# Patient Record
Sex: Female | Born: 1975 | Race: Black or African American | Hispanic: No | Marital: Single | Smoking: Never smoker
Health system: Southern US, Community
[De-identification: ages and names within clinical notes are randomized; demographics above are authoritative.]

## PROBLEM LIST (undated history)

## (undated) DIAGNOSIS — E785 Hyperlipidemia, unspecified: Secondary | ICD-10-CM

## (undated) DIAGNOSIS — A048 Other specified bacterial intestinal infections: Secondary | ICD-10-CM

## (undated) DIAGNOSIS — E559 Vitamin D deficiency, unspecified: Secondary | ICD-10-CM

## (undated) DIAGNOSIS — S12000A Unspecified displaced fracture of first cervical vertebra, initial encounter for closed fracture: Secondary | ICD-10-CM

## (undated) DIAGNOSIS — K589 Irritable bowel syndrome without diarrhea: Secondary | ICD-10-CM

## (undated) DIAGNOSIS — J309 Allergic rhinitis, unspecified: Secondary | ICD-10-CM

## (undated) DIAGNOSIS — J45909 Unspecified asthma, uncomplicated: Secondary | ICD-10-CM

## (undated) DIAGNOSIS — S0292XA Unspecified fracture of facial bones, initial encounter for closed fracture: Secondary | ICD-10-CM

## (undated) DIAGNOSIS — R0989 Other specified symptoms and signs involving the circulatory and respiratory systems: Secondary | ICD-10-CM

## (undated) DIAGNOSIS — D649 Anemia, unspecified: Secondary | ICD-10-CM

## (undated) DIAGNOSIS — R739 Hyperglycemia, unspecified: Secondary | ICD-10-CM

## (undated) HISTORY — DX: Allergic rhinitis, unspecified: J30.9

## (undated) HISTORY — DX: Unspecified asthma, uncomplicated: J45.909

## (undated) HISTORY — DX: Hyperlipidemia, unspecified: E78.5

## (undated) HISTORY — PX: COLPOSCOPY: SHX161

## (undated) HISTORY — DX: Unspecified displaced fracture of first cervical vertebra, initial encounter for closed fracture: S12.000A

## (undated) HISTORY — DX: Anemia, unspecified: D64.9

## (undated) HISTORY — DX: Other specified symptoms and signs involving the circulatory and respiratory systems: R09.89

## (undated) HISTORY — DX: Vitamin D deficiency, unspecified: E55.9

## (undated) HISTORY — DX: Hyperglycemia, unspecified: R73.9

## (undated) HISTORY — DX: Irritable bowel syndrome without diarrhea: K58.9

## (undated) HISTORY — DX: Other specified bacterial intestinal infections: A04.8

## (undated) HISTORY — DX: Unspecified fracture of facial bones, initial encounter for closed fracture: S02.92XA

---

## 1998-01-30 ENCOUNTER — Emergency Department (HOSPITAL_COMMUNITY): Admission: EM | Admit: 1998-01-30 | Discharge: 1998-01-30 | Payer: Self-pay | Admitting: Emergency Medicine

## 1998-02-14 ENCOUNTER — Emergency Department (HOSPITAL_COMMUNITY): Admission: EM | Admit: 1998-02-14 | Discharge: 1998-02-14 | Payer: Self-pay | Admitting: Emergency Medicine

## 1998-05-15 ENCOUNTER — Other Ambulatory Visit: Admission: RE | Admit: 1998-05-15 | Discharge: 1998-05-15 | Payer: Self-pay | Admitting: *Deleted

## 1998-11-07 ENCOUNTER — Inpatient Hospital Stay (HOSPITAL_COMMUNITY): Admission: AD | Admit: 1998-11-07 | Discharge: 1998-11-09 | Payer: Self-pay | Admitting: *Deleted

## 1998-11-09 ENCOUNTER — Encounter (HOSPITAL_COMMUNITY): Admission: RE | Admit: 1998-11-09 | Discharge: 1999-02-07 | Payer: Self-pay | Admitting: Obstetrics and Gynecology

## 2003-08-22 ENCOUNTER — Other Ambulatory Visit: Admission: RE | Admit: 2003-08-22 | Discharge: 2003-08-22 | Payer: Self-pay | Admitting: Obstetrics and Gynecology

## 2006-04-06 LAB — HM MAMMOGRAPHY: HM Mammogram: NORMAL

## 2010-04-06 LAB — HM PAP SMEAR: HM Pap smear: NORMAL

## 2011-06-24 ENCOUNTER — Other Ambulatory Visit (HOSPITAL_COMMUNITY): Payer: Self-pay | Admitting: Internal Medicine

## 2011-06-24 DIAGNOSIS — N920 Excessive and frequent menstruation with regular cycle: Secondary | ICD-10-CM

## 2011-06-24 DIAGNOSIS — D649 Anemia, unspecified: Secondary | ICD-10-CM

## 2011-06-24 DIAGNOSIS — R5383 Other fatigue: Secondary | ICD-10-CM

## 2011-06-24 DIAGNOSIS — E611 Iron deficiency: Secondary | ICD-10-CM

## 2011-06-29 ENCOUNTER — Ambulatory Visit (HOSPITAL_COMMUNITY)
Admission: RE | Admit: 2011-06-29 | Discharge: 2011-06-29 | Disposition: A | Discharge status: Home / Self Care | Payer: Managed Care, Other (non HMO) | Source: Ambulatory Visit | Attending: Internal Medicine | Admitting: Internal Medicine

## 2011-06-29 DIAGNOSIS — D509 Iron deficiency anemia, unspecified: Secondary | ICD-10-CM | POA: Insufficient documentation

## 2011-06-29 DIAGNOSIS — D649 Anemia, unspecified: Secondary | ICD-10-CM

## 2011-06-29 DIAGNOSIS — D251 Intramural leiomyoma of uterus: Secondary | ICD-10-CM | POA: Insufficient documentation

## 2011-06-29 DIAGNOSIS — N920 Excessive and frequent menstruation with regular cycle: Secondary | ICD-10-CM | POA: Insufficient documentation

## 2011-06-29 DIAGNOSIS — R5383 Other fatigue: Secondary | ICD-10-CM

## 2011-06-29 DIAGNOSIS — E611 Iron deficiency: Secondary | ICD-10-CM

## 2012-03-28 ENCOUNTER — Encounter: Payer: Self-pay | Admitting: Internal Medicine

## 2012-04-06 HISTORY — PX: COLONOSCOPY W/ POLYPECTOMY: SHX1380

## 2012-04-22 ENCOUNTER — Encounter: Payer: Self-pay | Admitting: Internal Medicine

## 2012-04-22 ENCOUNTER — Ambulatory Visit (INDEPENDENT_AMBULATORY_CARE_PROVIDER_SITE_OTHER): Payer: Managed Care, Other (non HMO) | Admitting: Internal Medicine

## 2012-04-22 VITALS — BP 120/76 | HR 80 | Ht 62.0 in | Wt 186.0 lb

## 2012-04-22 DIAGNOSIS — D509 Iron deficiency anemia, unspecified: Secondary | ICD-10-CM

## 2012-04-22 DIAGNOSIS — Z800 Family history of malignant neoplasm of digestive organs: Secondary | ICD-10-CM

## 2012-04-22 MED ORDER — MOVIPREP 100 G PO SOLR: 1.0000 | Freq: Once | ORAL | Status: DC

## 2012-04-22 NOTE — Patient Instructions (Addendum)
You have been scheduled for a colonoscopy with propofol. Please follow written instructions given to you at your visit today.  Please pick up your prep kit at the pharmacy within the next 1-3 days. If you use inhalers (even only as needed) or a CPAP machine, please bring them with you on the day of your procedure.  

## 2012-04-22 NOTE — Progress Notes (Signed)
HISTORY OF PRESENT ILLNESS:  [name] is a 37 y.o. female , new to GI, who is referred today, by Loree Fee PA, regarding newly diagnosed iron deficiency anemia. Patient underwent routine annual evaluation with her primary provider. Laboratories obtained 03/24/2012 revealed anemia with a hemoglobin of 10.2. MCV 82.1. Subsequent iron studies revealed iron deficiency with iron saturation of 8%. She was provided with Hemoccult cards, but has not returned them yet. Other laboratories including comprehensive metabolic panel, lipid panel, B12 level, TSH, and urinalysis were unremarkable. Vitamin D level was low at 24. The patient's GI review of systems is entirely negative. Of importance, her father was diagnosed with colon cancer at age 50. Patient does have regular menstrual periods monthly. She describes normal flow to occasional heavy flow. She was recently started on vitamin D as well as multivitamin with iron. She takes NSAIDs for several days of the month during her menstrual cycle. She denies melena or hematochezia. She has no siblings.  REVIEW OF SYSTEMS:  All non-GI ROS negative except for sinus trouble  Past Medical History  Diagnosis Date  . Clostridium difficile infection   . Anemia   . Allergic rhinitis   . Asthma   . Hyperlipidemia     Past Surgical History  Procedure Date  . Colposcopy     Social History [name]  reports that she has never smoked. She has never used smokeless tobacco. She reports that she does not drink alcohol or use illicit drugs.  family history includes Breast cancer in her paternal aunt; Colon cancer (age of onset:58) in her father; Diabetes in her father; Hypertension in her father; and Stroke in her maternal grandmother.  No Known Allergies     PHYSICAL EXAMINATION: Vital signs: BP 120/76  Pulse 80  Ht 5\' 2"  (1.575 m)  Wt 186 lb (84.369 kg)  BMI 34.02 kg/m2  Constitutional: generally well-appearing, no acute  distress Psychiatric: alert and oriented x3, cooperative Eyes: extraocular movements intact, anicteric, conjunctiva pink Mouth: oral pharynx moist, no lesions Neck: supple no lymphadenopathy Cardiovascular: heart regular rate and rhythm, no murmur Lungs: clear to auscultation bilaterally Abdomen: soft, nontender, nondistended, no obvious ascites, no peritoneal signs, normal bowel sounds, no organomegaly Rectal: Deferred until colonoscopy Extremities: no lower extremity edema bilaterally Skin: no lesions on visible extremities Neuro: No focal deficits.   ASSESSMENT:  #1. Iron deficiency anemia. Rule out GI mucosal lesion, such as neoplasia or NSAID-induced lesion. Possibly related to chronic menstrual loss #2. Family history of colon cancer in her father at age 86   PLAN:  #1. Colonoscopy and upper endoscopy (with possible duodenal biopsies).The nature of the procedure, as well as the risks, benefits, and alternatives were carefully and thoroughly reviewed with the patient. Ample time for discussion and questions allowed. The patient understood, was satisfied, and agreed to proceed.  #2. If endoscopic evaluations unrevealing, then chronic iron therapy as long as she is menstruating and periodic CBCs with primary provider to assure resolution of anemia and normalized blood counts longitudinally

## 2012-04-25 ENCOUNTER — Telehealth: Payer: Self-pay

## 2012-04-25 NOTE — Telephone Encounter (Signed)
Realized I had accidentally scheduled a single procedure for patient instead of a double.  Left her a message regarding rescheduling her to later in the day on the day of her procedure

## 2012-04-26 ENCOUNTER — Telehealth: Payer: Self-pay

## 2012-04-26 ENCOUNTER — Telehealth: Payer: Self-pay | Admitting: Internal Medicine

## 2012-04-26 NOTE — Telephone Encounter (Signed)
Was able to clarify with pt that I had moved her procedure from 8:00am to 2:30pm in order to accommodate the double procedure.  It had originally been scheduled in a single spot.  I went over the changes on her instructions and encouraged her to call me if she had any questions.  Patient understood and agreed

## 2012-04-26 NOTE — Telephone Encounter (Signed)
LM on VM for pt to call me back regarding changing her procedure time.  2nd attempt at contact

## 2012-05-11 ENCOUNTER — Ambulatory Visit: Payer: Managed Care, Other (non HMO) | Admitting: Obstetrics and Gynecology

## 2012-05-11 ENCOUNTER — Encounter: Payer: Self-pay | Admitting: Obstetrics and Gynecology

## 2012-05-11 VITALS — BP 110/70 | HR 70 | Resp 16 | Ht 62.0 in | Wt 187.0 lb

## 2012-05-11 DIAGNOSIS — Z01419 Encounter for gynecological examination (general) (routine) without abnormal findings: Secondary | ICD-10-CM

## 2012-05-11 DIAGNOSIS — Z124 Encounter for screening for malignant neoplasm of cervix: Secondary | ICD-10-CM

## 2012-05-11 NOTE — Progress Notes (Signed)
ANNUAL GYNECOLOGIC EXAMINATION   [name] is a 37 y.o. female, No obstetric history on file., who presents for an annual exam. The patient has a history of anemia.  Her father had colon cancer.  She is scheduled for colonoscopy in the near future.  An ultrasound was done in March 2013 that showed a 1 cm intramural fibroid.  The patient says her cycles are normal.   History   Social History  . Marital Status: Single    Spouse Name: N/A    Number of Children: 1  . Years of Education: N/A   Occupational History  .  [employer]   Social History Main Topics  . Smoking status: Never Smoker   . Smokeless tobacco: Never Used  . Alcohol Use: No  . Drug Use: No  . Sexually Active: Yes    Birth Control/ Protection: Condom   Other Topics Concern  . None   Social History Narrative   Daily caffeine     Menstrual cycle:   LMP: Patient's last menstrual period was 05/03/2011.             The following portions of the patient's history were reviewed and updated as appropriate: allergies, current medications, past family history, past medical history, past social history, past surgical history and problem list.  Review of Systems Pertinent items are noted in HPI. Breast:Negative for breast lump,nipple discharge or nipple retraction Gastrointestinal: Negative for abdominal pain, change in bowel habits or rectal bleeding Urinary:negative   Objective:    BP 110/70  Pulse 70  Resp 16  Ht 5\' 2"  (1.575 m)  Wt 187 lb (84.823 kg)  BMI 34.20 kg/m2  LMP 05/03/2011    Weight:  Wt Readings from Last 1 Encounters:  05/11/12 187 lb (84.823 kg)          BMI: Body mass index is 34.20 kg/(m^2).  General Appearance: Alert, appropriate appearance for age. No acute distress HEENT: Grossly normal Neck / Thyroid: Supple, no masses, nodes or enlargement Lungs: clear to auscultation bilaterally Back: No CVA tenderness Breast Exam: No masses or nodes.No dimpling, nipple retraction or  discharge. Cardiovascular: Regular rate and rhythm. S1, S2, no murmur Gastrointestinal: Soft, non-tender, no masses or organomegaly  ++++++++++++++++++++++++++++++++++++++++++++++++++++++++  Pelvic Exam: External genitalia: normal general appearance Vaginal: normal without tenderness, induration or masses. Relaxation: No Cervix: normal appearance Adnexa: normal bimanual exam Uterus: normal size, shape, and consistency Rectovaginal: normal rectal, no masses  ++++++++++++++++++++++++++++++++++++++++++++++++++++++++  Lymphatic Exam: Non-palpable nodes in neck, clavicular, axillary, or inguinal regions Neurologic: Normal speech, no tremor  Psychiatric: Alert and oriented, appropriate affect.  Assessment:    Normal gyn exam   Overweight or obese: Yes   Pelvic relaxation: No  Anemia  Father with colon cancer   Plan:    pap smear return annually or prn Contraception:condoms  Anemia is being evaluated by her primary physician.  Colonoscopy is scheduled.   Medications prescribed: none  STD screen request: No   The updated Pap smear screening guidelines were discussed with the patient. The patient requested that I obtain a Pap smear: Yes.  Kegel exercises discussed: Yes.  Proper diet and regular exercise were reviewed.  Annual mammograms recommended starting at age 103. Proper breast care was discussed.  Screening colonoscopy is recommended beginning at age 74.  Regular health maintenance was reviewed.  Sleep hygiene was discussed.  Adequate calcium and vitamin D intake was emphasized.  Leonard Schwartz M.D.    Regular Periods: yes Mammogram: no  Monthly Breast Ex.: yes Exercise: yes  Tetanus < 10 years: yes Seatbelts: yes  NI. Bladder Functn.: yes Abuse at home: no  Daily BM's: yes Stressful Work: no  Healthy Diet: yes Sigmoid-Colonoscopy: none  Calcium: no Medical problems this year: cyst noted on U/S   LAST PAP:02/19/2011 WNL  Contraception:  Condoms  Mammogram:  None  PCP: Loree Fee  PMH: Cyst noted on U/S  [facility]: Father DX with Colon CA--2013  Last Bone Scan: none

## 2012-05-12 LAB — PAP IG W/ RFLX HPV ASCU

## 2012-05-26 ENCOUNTER — Encounter: Payer: Managed Care, Other (non HMO) | Admitting: Internal Medicine

## 2013-03-26 ENCOUNTER — Encounter: Payer: Self-pay | Admitting: Internal Medicine

## 2013-03-27 ENCOUNTER — Encounter: Payer: Self-pay | Admitting: Emergency Medicine

## 2013-04-06 DIAGNOSIS — A048 Other specified bacterial intestinal infections: Secondary | ICD-10-CM

## 2013-04-06 HISTORY — DX: Other specified bacterial intestinal infections: A04.8

## 2013-11-15 ENCOUNTER — Encounter: Payer: Self-pay | Admitting: Physician Assistant

## 2013-11-15 ENCOUNTER — Ambulatory Visit (INDEPENDENT_AMBULATORY_CARE_PROVIDER_SITE_OTHER): Payer: Managed Care, Other (non HMO) | Admitting: Physician Assistant

## 2013-11-15 VITALS — BP 130/88 | HR 80 | Temp 98.1°F | Resp 16 | Ht 62.0 in | Wt 183.0 lb

## 2013-11-15 DIAGNOSIS — R0602 Shortness of breath: Secondary | ICD-10-CM

## 2013-11-15 DIAGNOSIS — R1013 Epigastric pain: Secondary | ICD-10-CM

## 2013-11-15 DIAGNOSIS — K219 Gastro-esophageal reflux disease without esophagitis: Secondary | ICD-10-CM

## 2013-11-15 DIAGNOSIS — G8929 Other chronic pain: Secondary | ICD-10-CM

## 2013-11-15 LAB — CBC WITH DIFFERENTIAL/PLATELET
Basophils Absolute: 0 10*3/uL (ref 0.0–0.1)
Basophils Relative: 0 % (ref 0–1)
Eosinophils Absolute: 0.1 10*3/uL (ref 0.0–0.7)
Eosinophils Relative: 2 % (ref 0–5)
HEMATOCRIT: 33.1 % — AB (ref 36.0–46.0)
Hemoglobin: 10.8 g/dL — ABNORMAL LOW (ref 12.0–15.0)
LYMPHS ABS: 2.9 10*3/uL (ref 0.7–4.0)
LYMPHS PCT: 45 % (ref 12–46)
MCH: 26.3 pg (ref 26.0–34.0)
MCHC: 32.6 g/dL (ref 30.0–36.0)
MCV: 80.7 fL (ref 78.0–100.0)
Monocytes Absolute: 0.5 10*3/uL (ref 0.1–1.0)
Monocytes Relative: 8 % (ref 3–12)
Neutro Abs: 2.9 10*3/uL (ref 1.7–7.7)
Neutrophils Relative %: 45 % (ref 43–77)
Platelets: 409 10*3/uL — ABNORMAL HIGH (ref 150–400)
RBC: 4.1 MIL/uL (ref 3.87–5.11)
RDW: 14.7 % (ref 11.5–15.5)
WBC: 6.5 10*3/uL (ref 4.0–10.5)

## 2013-11-15 LAB — BASIC METABOLIC PANEL WITH GFR
BUN: 10 mg/dL (ref 6–23)
CALCIUM: 9.7 mg/dL (ref 8.4–10.5)
CHLORIDE: 102 meq/L (ref 96–112)
CO2: 27 meq/L (ref 19–32)
Creat: 0.96 mg/dL (ref 0.50–1.10)
GFR, Est African American: 87 mL/min
GFR, Est Non African American: 76 mL/min
GLUCOSE: 82 mg/dL (ref 70–99)
Potassium: 4.3 mEq/L (ref 3.5–5.3)
Sodium: 135 mEq/L (ref 135–145)

## 2013-11-15 LAB — HEPATIC FUNCTION PANEL
ALK PHOS: 61 U/L (ref 39–117)
ALT: 14 U/L (ref 0–35)
AST: 15 U/L (ref 0–37)
Albumin: 4 g/dL (ref 3.5–5.2)
BILIRUBIN TOTAL: 0.3 mg/dL (ref 0.2–1.2)
Bilirubin, Direct: 0.1 mg/dL (ref 0.0–0.3)
Indirect Bilirubin: 0.2 mg/dL (ref 0.2–1.2)
Total Protein: 7.5 g/dL (ref 6.0–8.3)

## 2013-11-15 LAB — AMYLASE: Amylase: 65 U/L (ref 0–105)

## 2013-11-15 NOTE — Patient Instructions (Signed)
Cholelithiasis Cholelithiasis (also called gallstones) is a form of gallbladder disease in which gallstones form in your gallbladder. The gallbladder is an organ that stores bile made in the liver, which helps digest fats. Gallstones begin as small crystals and slowly grow into stones. Gallstone pain occurs when the gallbladder spasms and a gallstone is blocking the duct. Pain can also occur when a stone passes out of the duct.  RISK FACTORS  Being female.   Having multiple pregnancies. Health care providers sometimes advise removing diseased gallbladders before future pregnancies.   Being obese.  Eating a diet heavy in fried foods and fat.   Being older than 34 years and increasing age.   Prolonged use of medicines containing female hormones.   Having diabetes mellitus.   Rapidly losing weight.   Having a family history of gallstones (heredity).  SYMPTOMS  Nausea.   Vomiting.  Abdominal pain.   Yellowing of the skin (jaundice).   Sudden pain. It may persist from several minutes to several hours.  Fever.   Tenderness to the touch. In some cases, when gallstones do not move into the bile duct, people have no pain or symptoms. These are called "silent" gallstones.  TREATMENT Silent gallstones do not need treatment. In severe cases, emergency surgery may be required. Options for treatment include:  Surgery to remove the gallbladder. This is the most common treatment.  Medicines. These do not always work and may take 6-12 months or more to work.  Shock wave treatment (extracorporeal biliary lithotripsy). In this treatment an ultrasound machine sends shock waves to the gallbladder to break gallstones into smaller pieces that can pass into the intestines or be dissolved by medicine. HOME CARE INSTRUCTIONS   Only take over-the-counter or prescription medicines for pain, discomfort, or fever as directed by your health care provider.   Follow a low-fat diet until  seen again by your health care provider. Fat causes the gallbladder to contract, which can result in pain.   Follow up with your health care provider as directed. Attacks are almost always recurrent and surgery is usually required for permanent treatment.  SEEK IMMEDIATE MEDICAL CARE IF:   Your pain increases and is not controlled by medicines.   You have a fever or persistent symptoms for more than 2-3 days.   You have a fever and your symptoms suddenly get worse.   You have persistent nausea and vomiting.  MAKE SURE YOU:   Understand these instructions.  Will watch your condition.  Will get help right away if you are not doing well or get worse. Document Released: 03/19/2005 Document Revised: 11/23/2012 Document Reviewed: 09/14/2012 [facility] Patient Information 2015 [place], [place]. This information is not intended to replace advice given to you by your health care provider. Make sure you discuss any questions you have with your health care provider.   Food Choices for Gastroesophageal Reflux Disease When you have gastroesophageal reflux disease (GERD), the foods you eat and your eating habits are very important. Choosing the right foods can help ease the discomfort of GERD. WHAT GENERAL GUIDELINES DO I NEED TO FOLLOW?  Choose fruits, vegetables, whole grains, low-fat dairy products, and low-fat meat, fish, and poultry.  Limit fats such as oils, salad dressings, butter, nuts, and avocado.  Keep a food diary to identify foods that cause symptoms.  Avoid foods that cause reflux. These may be different for different people.  Eat frequent small meals instead of three large meals each day.  Eat your meals slowly, in  a relaxed setting.  Limit fried foods.  Cook foods using methods other than frying.  Avoid drinking alcohol.  Avoid drinking large amounts of liquids with your meals.  Avoid bending over or lying down until 2-3 hours after eating. WHAT FOODS ARE NOT  RECOMMENDED? The following are some foods and drinks that may worsen your symptoms: Vegetables Tomatoes. Tomato juice. Tomato and spaghetti sauce. Chili peppers. Onion and garlic. Horseradish. Fruits Oranges, grapefruit, and lemon (fruit and juice). Meats High-fat meats, fish, and poultry. This includes hot dogs, ribs, ham, sausage, salami, and bacon. Dairy Whole milk and chocolate milk. Sour cream. Cream. Butter. Ice cream. Cream cheese.  Beverages Coffee and tea, with or without caffeine. Carbonated beverages or energy drinks. Condiments Hot sauce. Barbecue sauce.  Sweets/Desserts Chocolate and cocoa. Donuts. Peppermint and spearmint. Fats and Oils High-fat foods, including [place] fries and potato chips. Other Vinegar. Strong spices, such as black pepper, white pepper, red pepper, cayenne, curry powder, cloves, ginger, and chili powder.

## 2013-11-15 NOTE — Progress Notes (Signed)
   Subjective:    Patient ID: [name], female    DOB: 02-Aug-1975, 38 y.o.   MRN: [id number]  HPI 38 y.o. female presents with back/abdominal pain since yesterday. She was driving when she started to have a sharp right shoulder pain with radiation to her right shoulder blade with some SOB, worse with a deep breath, with bletching, she had ate cereal with whole mild the night before, took tums and warm water, helped some but then this AM still some discomfort but not as intense. 1 hour after eating started to have burning epigastric pain.  Denies decreased appetite, chills, fever, diarrhea, constipation, melana, BRBPR. Denies chance of pregnancy. Recent travel to [place] last week, stopped once there and once back. Grandmother with history of blood clots but no immediate family, no BCP, no smoking.    Review of Systems  Constitutional: Negative.   HENT: Negative.   Respiratory: Positive for chest tightness and shortness of breath. Negative for apnea, cough, choking, wheezing and stridor.   Cardiovascular: Positive for chest pain. Negative for palpitations and leg swelling.  Gastrointestinal: Positive for abdominal pain. Negative for nausea, vomiting, diarrhea, constipation, blood in stool, abdominal distention, anal bleeding and rectal pain.  Genitourinary: Negative.   Musculoskeletal: Negative.   Neurological: Negative.        Objective:   Physical Exam  Constitutional: She is oriented to person, place, and time. She appears well-developed and well-nourished.  HENT:  Head: Normocephalic and atraumatic.  Right Ear: External ear normal.  Left Ear: External ear normal.  Mouth/Throat: Oropharynx is clear and moist.  Eyes: Conjunctivae and EOM are normal. Pupils are equal, round, and reactive to light.  Neck: Normal range of motion. Neck supple. No thyromegaly present.  Cardiovascular: Normal rate, regular rhythm and normal heart sounds.  Exam reveals no gallop and no friction rub.   No  murmur heard. Pulmonary/Chest: Effort normal and breath sounds normal. No respiratory distress. She has no wheezes.  Abdominal: Soft. Bowel sounds are normal. She exhibits no shifting dullness, no distension, no abdominal bruit, no pulsatile midline mass and no mass. There is no hepatosplenomegaly. There is tenderness in the right upper quadrant and epigastric area. There is guarding. There is no rigidity, no rebound, no CVA tenderness, no tenderness at McBurney's point and negative Murphy's sign. No hernia.  Musculoskeletal: Normal range of motion.  Lymphadenopathy:    She has no cervical adenopathy.  Neurological: She is alert and oriented to person, place, and time.  Skin: Skin is warm and dry.  Psychiatric: She has a normal mood and affect.        Assessment & Plan:  Epigastric pain:? infection, ulcer ,GB, pancreatitis, PE with recent travel-  check labs, bland diet, small portions, increase H20, PPI, check Hpylori,  Very low risk PE but with recent travel will get Ddimer if pain continues will return for abdominal U/S.  Patient advised to go to the ER if the symptoms increase or worsen.

## 2013-11-16 LAB — D-DIMER, QUANTITATIVE (NOT AT ARMC): D DIMER QUANT: 0.29 ug{FEU}/mL (ref 0.00–0.48)

## 2013-11-17 LAB — HELICOBACTER PYLORI ABS-IGG+IGA, BLD
H PYLORI IGG: 0.54 {ISR}
HELICOBACTER PYLORI AB, IGA: 13.1 U/mL — ABNORMAL HIGH (ref <9.0)

## 2013-11-18 ENCOUNTER — Encounter: Payer: Self-pay | Admitting: Physician Assistant

## 2013-11-18 MED ORDER — BIS SUBCIT-METRONID-TETRACYC 140-125-125 MG PO CAPS: 3.0000 | ORAL_CAPSULE | Freq: Three times a day (TID) | ORAL | Status: DC

## 2013-11-18 NOTE — Addendum Note (Signed)
Addended by: Vicie Mutters R on: 11/18/2013 03:21 PM   Modules accepted: Orders

## 2013-12-03 DIAGNOSIS — J309 Allergic rhinitis, unspecified: Secondary | ICD-10-CM | POA: Insufficient documentation

## 2013-12-03 DIAGNOSIS — J45909 Unspecified asthma, uncomplicated: Secondary | ICD-10-CM | POA: Insufficient documentation

## 2013-12-03 DIAGNOSIS — E559 Vitamin D deficiency, unspecified: Secondary | ICD-10-CM | POA: Insufficient documentation

## 2013-12-06 ENCOUNTER — Ambulatory Visit (INDEPENDENT_AMBULATORY_CARE_PROVIDER_SITE_OTHER): Payer: Managed Care, Other (non HMO) | Admitting: Physician Assistant

## 2013-12-06 ENCOUNTER — Encounter: Payer: Self-pay | Admitting: Physician Assistant

## 2013-12-06 VITALS — BP 128/80 | HR 72 | Temp 98.5°F | Resp 16 | Ht 62.0 in | Wt 185.0 lb

## 2013-12-06 DIAGNOSIS — K219 Gastro-esophageal reflux disease without esophagitis: Secondary | ICD-10-CM

## 2013-12-06 DIAGNOSIS — D509 Iron deficiency anemia, unspecified: Secondary | ICD-10-CM

## 2013-12-06 DIAGNOSIS — A048 Other specified bacterial intestinal infections: Secondary | ICD-10-CM

## 2013-12-06 DIAGNOSIS — G4700 Insomnia, unspecified: Secondary | ICD-10-CM

## 2013-12-06 LAB — CBC WITH DIFFERENTIAL/PLATELET
BASOS ABS: 0 10*3/uL (ref 0.0–0.1)
Basophils Relative: 0 % (ref 0–1)
EOS PCT: 2 % (ref 0–5)
Eosinophils Absolute: 0.1 10*3/uL (ref 0.0–0.7)
HCT: 33 % — ABNORMAL LOW (ref 36.0–46.0)
Hemoglobin: 10.4 g/dL — ABNORMAL LOW (ref 12.0–15.0)
LYMPHS ABS: 3.3 10*3/uL (ref 0.7–4.0)
LYMPHS PCT: 48 % — AB (ref 12–46)
MCH: 26.3 pg (ref 26.0–34.0)
MCHC: 31.5 g/dL (ref 30.0–36.0)
MCV: 83.3 fL (ref 78.0–100.0)
Monocytes Absolute: 0.4 10*3/uL (ref 0.1–1.0)
Monocytes Relative: 6 % (ref 3–12)
NEUTROS ABS: 3 10*3/uL (ref 1.7–7.7)
Neutrophils Relative %: 44 % (ref 43–77)
Platelets: 400 10*3/uL (ref 150–400)
RBC: 3.96 MIL/uL (ref 3.87–5.11)
RDW: 14.3 % (ref 11.5–15.5)
WBC: 6.8 10*3/uL (ref 4.0–10.5)

## 2013-12-06 MED ORDER — CLARITHROMYCIN 500 MG PO TABS: 500.0000 mg | ORAL_TABLET | Freq: Two times a day (BID) | ORAL | Status: DC

## 2013-12-06 MED ORDER — AMOXICILLIN 500 MG PO CAPS: 500.0000 mg | ORAL_CAPSULE | Freq: Four times a day (QID) | ORAL | Status: DC

## 2013-12-06 MED ORDER — FLUCONAZOLE 150 MG PO TABS: 150.0000 mg | ORAL_TABLET | Freq: Every day | ORAL | Status: DC

## 2013-12-06 MED ORDER — OMEPRAZOLE 20 MG PO CPDR: 20.0000 mg | DELAYED_RELEASE_CAPSULE | Freq: Two times a day (BID) | ORAL | Status: DC

## 2013-12-06 NOTE — Patient Instructions (Signed)
You did test positive for H pylori which is a bacterial infection that comes from food. It can cause GERD as well as lead to stomach ulcers. It is very difficult to treat. We are going to call in two antibiotics and one stomach medication for you. It is very important to take all of the antibiotics and stomach medication (PPI) or it will not get rid of the bacteria infection. Please start a probiotic pill, eat yogurt and call if you need anything.  For sleep try Melatonin can start at 3-5mg  at night and go up to 15mg  at night.  Can try tylenol PM 1-2 at night.  Please read the good SLEEP HYGIENE BELOW  Insomnia Insomnia is frequent trouble falling and/or staying asleep. Insomnia can be a long term problem or a short term problem. Both are common. Insomnia can be a short term problem when the wakefulness is related to a certain stress or worry. Long term insomnia is often related to ongoing stress during waking hours and/or poor sleeping habits. Overtime, sleep deprivation itself can make the problem worse. Every little thing feels more severe because you are overtired and your ability to cope is decreased. CAUSES   Stress, anxiety, and depression.  Poor sleeping habits.  Distractions such as TV in the bedroom.  Naps close to bedtime.  Engaging in emotionally charged conversations before bed.  Technical reading before sleep.  Alcohol and other sedatives. They may make the problem worse. They can hurt normal sleep patterns and normal dream activity.  Stimulants such as caffeine for several hours prior to bedtime.  Pain syndromes and shortness of breath can cause insomnia.  Exercise late at night.  Changing time zones may cause sleeping problems (jet lag). It is sometimes helpful to have someone observe your sleeping patterns. They should look for periods of not breathing during the night (sleep apnea). They should also look to see how long those periods last. If you live alone or  observers are uncertain, you can also be observed at a sleep clinic where your sleep patterns will be professionally monitored. Sleep apnea requires a checkup and treatment. Give your caregivers your medical history. Give your caregivers observations your family has made about your sleep.  SYMPTOMS   Not feeling rested in the morning.  Anxiety and restlessness at bedtime.  Difficulty falling and staying asleep. TREATMENT   Your caregiver may prescribe treatment for an underlying medical disorders. Your caregiver can give advice or help if you are using alcohol or other drugs for self-medication. Treatment of underlying problems will usually eliminate insomnia problems.  Medications can be prescribed for short time use. They are generally not recommended for lengthy use.  Over-the-counter sleep medicines are not recommended for lengthy use. They can be habit forming.  You can promote easier sleeping by making lifestyle changes such as:  Using relaxation techniques that help with breathing and reduce muscle tension.  Exercising earlier in the day.  Changing your diet and the time of your last meal. No night time snacks.  Establish a regular time to go to bed.  Counseling can help with stressful problems and worry.  Soothing music and white noise may be helpful if there are background noises you cannot remove.  Stop tedious detailed work at least one hour before bedtime. HOME CARE INSTRUCTIONS   Keep a diary. Inform your caregiver about your progress. This includes any medication side effects. See your caregiver regularly. Take note of:  Times when you are asleep.  Times when you are awake during the night.  The quality of your sleep.  How you feel the next day. This information will help your caregiver care for you.  Get out of bed if you are still awake after 15 minutes. Read or do some quiet activity. Keep the lights down. Wait until you feel sleepy and go back to  bed.  Keep regular sleeping and waking hours. Avoid naps.  Exercise regularly.  Avoid distractions at bedtime. Distractions include watching television or engaging in any intense or detailed activity like attempting to balance the household checkbook.  Develop a bedtime ritual. Keep a familiar routine of bathing, brushing your teeth, climbing into bed at the same time each night, listening to soothing music. Routines increase the success of falling to sleep faster.  Use relaxation techniques. This can be using breathing and muscle tension release routines. It can also include visualizing peaceful scenes. You can also help control troubling or intruding thoughts by keeping your mind occupied with boring or repetitive thoughts like the old concept of counting sheep. You can make it more creative like imagining planting one beautiful flower after another in your backyard garden.  During your day, work to eliminate stress. When this is not possible use some of the previous suggestions to help reduce the anxiety that accompanies stressful situations. MAKE SURE YOU:   Understand these instructions.  Will watch your condition.  Will get help right away if you are not doing well or get worse. Document Released: 03/20/2000 Document Revised: 06/15/2011 Document Reviewed: 04/20/2007 [facility] Patient Information 2015 [place], [place]. This information is not intended to replace advice given to you by your health care provider. Make sure you discuss any questions you have with your health care provider.

## 2013-12-06 NOTE — Progress Notes (Signed)
   Subjective:    Patient ID: [name], female    DOB: Oct 05, 1975, 38 y.o.   MRN: [id number]  HPI 38 y.o. female that for a follow up. She presented with SOB, GERD symptoms after a trip to [place], all of her labs were normal but she had anemia and + early Hpylori infection. She has had a work up with GI for the anemia and after a negative work up her heavy menses are considered the source of the iron def. She was sent in medication for the Hyplori and took it sporadically but did not finish it. She is feeling better.   She has had insomnia for all of her life but it has been worse over the last year. She has a hard time getting to sleep and says she is a light sleeper, she sleeps with the TV on, will get back 4-5 hours a night.   Review of Systems  Constitutional: Negative.   HENT: Negative.  Negative for sore throat.   Eyes: Negative.   Respiratory: Positive for shortness of breath. Negative for cough, choking and wheezing.   Cardiovascular: Negative.  Negative for chest pain.  Gastrointestinal: Positive for nausea, abdominal pain and abdominal distention. Negative for vomiting, diarrhea, constipation, blood in stool, anal bleeding and rectal pain.  Genitourinary: Negative.   Musculoskeletal: Negative.   Skin: Negative.   Neurological: Negative.   Hematological: Negative.   Psychiatric/Behavioral: Positive for sleep disturbance. Negative for suicidal ideas, hallucinations, self-injury, dysphoric mood and decreased concentration. The patient is not nervous/anxious and is not hyperactive.        Objective:   Physical Exam  Constitutional: She is oriented to person, place, and time. She appears well-developed and well-nourished.  HENT:  Head: Normocephalic and atraumatic.  Eyes: Conjunctivae are normal. Pupils are equal, round, and reactive to light.  Neck: Normal range of motion. Neck supple.  Cardiovascular: Normal rate and regular rhythm.   Pulmonary/Chest: Effort normal  and breath sounds normal.  Abdominal: Soft. Bowel sounds are normal. She exhibits no distension. There is tenderness. There is no rebound and no guarding.  Musculoskeletal: Normal range of motion.  Neurological: She is alert and oriented to person, place, and time.        Assessment & Plan:  Hpylori- will do the amoxicillin/clarithromycin/prilosec, will do probiotic and will send in diflucan Insomnia- good sleep hygiene discussed, increase day time activity, try melatonin or benadryl if this does not help we will call in sleep medication.  Anemia- likely due to menses, suggest talking with OBGYN

## 2013-12-07 LAB — IRON AND TIBC
%SAT: 7 % — AB (ref 20–55)
Iron: 25 ug/dL — ABNORMAL LOW (ref 42–145)
TIBC: 373 ug/dL (ref 250–470)
UIBC: 348 ug/dL (ref 125–400)

## 2013-12-07 LAB — FOLATE RBC: RBC Folate: 632 ng/mL (ref >280)

## 2013-12-07 LAB — FERRITIN: Ferritin: 58 ng/mL (ref 10–291)

## 2013-12-08 ENCOUNTER — Telehealth: Payer: Self-pay

## 2013-12-08 NOTE — Telephone Encounter (Signed)
Called patient to advised that her Omeprazole was not covered, advised her to take OTC 20 mg twice daily and that we would attempt to get precerted with insurance company.

## 2013-12-13 LAB — HELICOBACTER PYLORI ABS-IGG+IGA, BLD
H Pylori IgG: 0.62 {ISR}
HELICOBACTER PYLORI AB, IGA: 11.2 U/mL — ABNORMAL HIGH (ref <9.0)

## 2014-03-28 ENCOUNTER — Encounter: Payer: Self-pay | Admitting: Emergency Medicine

## 2014-04-26 ENCOUNTER — Telehealth: Payer: Self-pay | Admitting: Internal Medicine

## 2014-04-26 NOTE — Telephone Encounter (Signed)
Left message, Due to inclement weather we are rescheduling Friday 04-27-14 appointments.  Please call the office. ° °Thank you, °Katrina Welch °[facility] °[phone] °Fax [phone] °

## 2014-04-26 NOTE — Telephone Encounter (Signed)
Left message, Due to inclement weather we are rescheduling Friday 04-27-14 appointments.  Please call the office. ° °Thank you, °Katrina Welch °[facility] °[phone] °Fax [phone] °

## 2014-04-27 ENCOUNTER — Encounter: Payer: Self-pay | Admitting: Emergency Medicine

## 2014-04-30 ENCOUNTER — Other Ambulatory Visit: Payer: Self-pay | Admitting: Emergency Medicine

## 2014-04-30 ENCOUNTER — Encounter: Payer: Self-pay | Admitting: Emergency Medicine

## 2014-04-30 ENCOUNTER — Ambulatory Visit (INDEPENDENT_AMBULATORY_CARE_PROVIDER_SITE_OTHER): Payer: BLUE CROSS/BLUE SHIELD | Admitting: Emergency Medicine

## 2014-04-30 VITALS — BP 138/80 | HR 76 | Temp 98.6°F | Resp 16 | Ht 62.5 in | Wt 186.0 lb

## 2014-04-30 DIAGNOSIS — E559 Vitamin D deficiency, unspecified: Secondary | ICD-10-CM

## 2014-04-30 DIAGNOSIS — D649 Anemia, unspecified: Secondary | ICD-10-CM

## 2014-04-30 DIAGNOSIS — R739 Hyperglycemia, unspecified: Secondary | ICD-10-CM

## 2014-04-30 DIAGNOSIS — R030 Elevated blood-pressure reading, without diagnosis of hypertension: Secondary | ICD-10-CM

## 2014-04-30 DIAGNOSIS — IMO0001 Reserved for inherently not codable concepts without codable children: Secondary | ICD-10-CM

## 2014-04-30 DIAGNOSIS — R6889 Other general symptoms and signs: Secondary | ICD-10-CM

## 2014-04-30 DIAGNOSIS — Z0000 Encounter for general adult medical examination without abnormal findings: Secondary | ICD-10-CM

## 2014-04-30 DIAGNOSIS — N390 Urinary tract infection, site not specified: Secondary | ICD-10-CM

## 2014-04-30 DIAGNOSIS — Z0001 Encounter for general adult medical examination with abnormal findings: Secondary | ICD-10-CM

## 2014-04-30 DIAGNOSIS — Z111 Encounter for screening for respiratory tuberculosis: Secondary | ICD-10-CM

## 2014-04-30 DIAGNOSIS — R7303 Prediabetes: Secondary | ICD-10-CM | POA: Insufficient documentation

## 2014-04-30 DIAGNOSIS — Z79899 Other long term (current) drug therapy: Secondary | ICD-10-CM

## 2014-04-30 LAB — HEMOGLOBIN A1C
HEMOGLOBIN A1C: 5.7 % — AB (ref <5.7)
Mean Plasma Glucose: 117 mg/dL — ABNORMAL HIGH (ref <117)

## 2014-04-30 NOTE — Progress Notes (Signed)
Subjective:    Patient ID: [name], female    DOB: 06-29-1975, 39 y.o.   MRN: [id number]  HPI Comments: 39 yo AAF CPE and elevated BP/ A1c follow up. SHE DOES NOT CHECK BP ROUTINELY BUT NOTES A FEW WEEKS back 120s/80s. She has never been on RX HTN medication. She also does not have meter to check BS. SHe notes she is trying to improve diet. She is not exercising routinely but is very active.  She has a history of Iron/ B12 anemia but has not been on any vitamins for several weeks. She notes occasionally fatigued but denies any CP/SOB. SHe does not + HX of small uterine fibroid since 2013 with irregular menses. She notes cycles have always been heavy but irratic over last several months.   She notes reflux and Hpylori were treated in 12/2013 and has had improved symptoms. She has not had retest of stool for clearance of H-pylori. She declines stool testing. She notes constipation since colonoscopy in 2014. She notes miralax helps but BM becomes too loose after several days use. She occasionally notes blood with hard stool. She denies abdomen pain.    Patient has HX of asthma but denies any current treatment or flares in > than 6 months.  Lab Results      Component                Value               Date                      WBC                      6.8                 12/06/2013                HGB                      10.4*               12/06/2013                HCT                      33.0*               12/06/2013                PLT                      400                 12/06/2013                GLUCOSE                  82                  11/15/2013                ALT                      14                  11/15/2013  AST                      15                  11/15/2013                NA                       135                 11/15/2013                K                        4.3                 11/15/2013                CL                       102                  11/15/2013                CREATININE               0.96                11/15/2013                BUN                      10                  11/15/2013                CO2                      27                  11/15/2013             Last labs in paper chart IRON 5 A1C 5.7 INSULIN 45 D 33 CHOL WNL     Medication List       This list is accurate as of: 04/30/14 11:59 PM.  Always use your most recent med list.               VITAMIN D PO  Take 1 tablet by mouth daily.       Allergies  Allergen Reactions  . Zyrtec [Cetirizine] Other (See Comments)    Jittery.    Past Medical History  Diagnosis Date  . Allergic rhinitis   . Spastic colon   . Anemia   . Asthma   . Hyperlipidemia   . Vitamin D deficiency   . Labile hypertension   . Labile hypertension   . H. pylori infection 2015    treated  . Blood glucose elevated    Past Surgical History  Procedure Laterality Date  . Colposcopy    . Colonoscopy w/ polypectomy  2014   History  Substance Use Topics  . Smoking status: Never Smoker   . Smokeless tobacco: Never Used  . Alcohol Use: No   Family History  Problem Relation Age of Onset  . Hypertension Father   . Diabetes Father   .  Cancer Father     prostate  . Stroke Maternal Grandmother   . Breast cancer Paternal Aunt   . Cancer Paternal Aunt     breast  . Hypertension Mother   . Arthritis Mother   . Stroke Mother   . Kidney disease Maternal Grandfather   . Stroke Paternal Grandfather      MAINTENANCE: Colonoscopy:08/22/2012 [facility] DUE 2019 polyps EGD: 08/22/2012 WNL Mammo:n/a BMD:n/a Pap/ Pelvic: WNL 03/2013 at [facility] EYE: n/a Dentist:Q 6 month  IMMUNIZATIONS: [id number] Pneumovax:n/a Zostavax:n/a Influenza: declines  Patient Care Team: Unk Pinto, MD as PCP - General (Internal Medicine) Lear Ng, MD as Consulting Physician (Gastroenterology) Ena Dawley, MD as Consulting Physician (Obstetrics and  Gynecology)  Marissa Calamity, (Dentist)  Review of Systems  Constitutional: Negative for fatigue.  Respiratory: Negative for cough, shortness of breath and wheezing.   Cardiovascular: Negative for chest pain.  Gastrointestinal: Positive for constipation.  Genitourinary: Positive for menstrual problem.  All other systems reviewed and are negative.  BP 138/80 mmHg  Pulse 76  Temp(Src) 98.6 F (37 C) (Temporal)  Resp 16  Ht 5' 2.5" (1.588 m)  Wt 186 lb (84.369 kg)  BMI 33.46 kg/m2  LMP 04/14/2014     Objective:   Physical Exam  Constitutional: She is oriented to person, place, and time. She appears well-developed and well-nourished. No distress.  overweight  HENT:  Head: Normocephalic.  Nose: Nose normal.  Mouth/Throat: Oropharynx is clear and moist.  Eyes: Conjunctivae and EOM are normal. Pupils are equal, round, and reactive to light. No scleral icterus.  Neck: Normal range of motion. Neck supple. No JVD present. No tracheal deviation present. No thyromegaly present.  Cardiovascular: Normal rate, regular rhythm, normal heart sounds and intact distal pulses.   Pulmonary/Chest: Effort normal and breath sounds normal.  Abdominal: Soft. Bowel sounds are normal. She exhibits no distension and no mass. There is no tenderness.  NO blood recently defer rectal exam to GI if reoccurs  Genitourinary:  DEF GYN  Musculoskeletal: Normal range of motion. She exhibits no edema or tenderness.  Lymphadenopathy:    She has no cervical adenopathy.  Neurological: She is alert and oriented to person, place, and time. She has normal reflexes. No cranial nerve deficit. She exhibits normal muscle tone. Coordination normal.  Skin: Skin is warm and dry. No rash noted. No erythema.  Psychiatric: She has a normal mood and affect. Her behavior is normal. Judgment and thought content normal.  Nursing note and vitals reviewed.     EKG NSCSPT WNL  Assessment & Plan:  1. CPE- Update screening labs/ History/  Immunizations/ Testing as needed. Advised healthy diet, QD exercise, increase H20 and continue RX/ Vitamins AD.  2. Anemia vs abnormal menses vs colon polyps- Recheck labs, take Vitamins AD increase green leafy veggies, increase H2O, w/c if any symptom increase. Advise may need GYN and GI recheck if labs are + for anemia  3. Constipation- Increase fiber/ water intake, decrease caffeine, increase activity level, can add Miralax or Metamucil QD until BM soft. If Blood reoccurs needs recheck GI with polyp HX  4. Elevated BP w/o  DX of HTN- Check BP call if >130/80, increase cardio   5. Elevated A1c- check labs, ADVISED diet/ weight loss improvement needed

## 2014-04-30 NOTE — Patient Instructions (Signed)
Constipation  Constipation is when a person has fewer than three bowel movements a week, has difficulty having a bowel movement, or has stools that are dry, hard, or larger than normal. As people grow older, constipation is more common. If you try to fix constipation with medicines that make you have a bowel movement (laxatives), the problem may get worse. Long-term laxative use may cause the muscles of the colon to become weak. A low-fiber diet, not taking in enough fluids, and taking certain medicines may make constipation worse.   CAUSES   · Certain medicines, such as antidepressants, pain medicine, iron supplements, antacids, and water pills.    · Certain diseases, such as diabetes, irritable bowel syndrome (IBS), thyroid disease, or depression.    · Not drinking enough water.    · Not eating enough fiber-rich foods.    · Stress or travel.    · Lack of physical activity or exercise.    · Ignoring the urge to have a bowel movement.    · Using laxatives too much.    SIGNS AND SYMPTOMS   · Having fewer than three bowel movements a week.    · Straining to have a bowel movement.    · Having stools that are hard, dry, or larger than normal.    · Feeling full or bloated.    · Pain in the lower abdomen.    · Not feeling relief after having a bowel movement.    DIAGNOSIS   Your health care provider will take a medical history and perform a physical exam. Further testing may be done for severe constipation. Some tests may include:  · A barium enema X-ray to examine your rectum, colon, and, sometimes, your small intestine.    · A sigmoidoscopy to examine your lower colon.    · A colonoscopy to examine your entire colon.  TREATMENT   Treatment will depend on the severity of your constipation and what is causing it. Some dietary treatments include drinking more fluids and eating more fiber-rich foods. Lifestyle treatments may include regular exercise. If these diet and lifestyle recommendations do not help, your health care  provider may recommend taking over-the-counter laxative medicines to help you have bowel movements. Prescription medicines may be prescribed if over-the-counter medicines do not work.   HOME CARE INSTRUCTIONS   · Eat foods that have a lot of fiber, such as fruits, vegetables, whole grains, and beans.  · Limit foods high in fat and processed sugars, such as french fries, hamburgers, cookies, candies, and soda.    · A fiber supplement may be added to your diet if you cannot get enough fiber from foods.    · Drink enough fluids to keep your urine clear or pale yellow.    · Exercise regularly or as directed by your health care provider.    · Go to the restroom when you have the urge to go. Do not hold it.    · Only take over-the-counter or prescription medicines as directed by your health care provider. Do not take other medicines for constipation without talking to your health care provider first.    SEEK IMMEDIATE MEDICAL CARE IF:   · You have bright red blood in your stool.    · Your constipation lasts for more than 4 days or gets worse.    · You have abdominal or rectal pain.    · You have thin, pencil-like stools.    · You have unexplained weight loss.  MAKE SURE YOU:   · Understand these instructions.  · Will watch your condition.  · Will get help right away if you are not   you have with your health care provider. Tuberculin Skin Test The PPD skin test is a method used to help with the diagnosis of a disease called tuberculosis (TB). HOW THE TEST IS DONE  The test site (usually the forearm) is cleansed. The PPD extract is then injected under the top layer of skin, causing a blister to form on the skin. The reaction  will take 48 - 72 hours to develop. You must return to your health care provider within that time to have the area checked. This will determine whether you have had a significant reaction to the PPD test. A reaction is measured in millimeters of hard swelling (induration) at the site. PREPARATION FOR TEST  There is no special preparation for this test. People with a skin rash or other skin irritations on their arms may need to have the test performed at a different spot on the body. Tell your health care provider if you have ever had a positive PPD skin test. If so, you should not have a repeat PPD test. Tell your doctor if you have a medical condition or if you take certain drugs, such as steroids, that can affect your immune system. These situations may lead to inaccurate test results. NORMAL FINDINGS A negative reaction (no induration) or a level of hard swelling that falls below a certain cutoff may mean that a person has not been infected with the bacteria that cause TB. There are different cutoffs for children, people with HIV, and other risk groups. Unfortunately, this is not a perfect test, and up to 20% of people infected with tuberculosis may not have a reaction on the PPD skin test. In addition, certain conditions that affect the immune system (cancer, recent chemotherapy, late-stage AIDS) may cause a false-negative test result.  The reaction will take 48 - 72 hours to develop. You must return to your health care provider within that time to have the area checked. Follow your caregiver's instructions as to where and when to report for this to be done. Ranges for normal findings may vary among different laboratories and hospitals. You should always check with your doctor after having lab work or other tests done to discuss the meaning of your test results and whether your values are considered within normal limits. WHAT ABNORMAL RESULTS MEAN  The results of the test depend on the size of the  skin reaction and on the person being tested.  A small reaction (5 mm of hard swelling at the site) is considered to be positive in people who have HIV, who are taking steroid therapy, or who have been in close contact with a person who has active tuberculosis. Larger reactions (greater than or equal to 10 mm) are considered positive in people with diabetes or kidney failure, and in health care workers, among others. In people with no known risks for tuberculosis, a positive reaction requires 15 mm or more of hard swelling at the site. RISKS AND COMPLICATIONS There is a very small risk of severe redness and swelling of the arm in people who have had a previous positive PPD test and who have the test again. There also have been a few rare cases of this reaction in people who have not been tested before. CONSIDERATIONS  A positive skin test does not necessarily mean that a person has active tuberculosis. More tests will be done to check whether active disease is present. Many people who were born outside the [place] may have had a vaccine called "  BCG," which can lead to a false-positive test result. MEANING OF TEST  Your caregiver will go over the test results with you and discuss the importance and meaning of your results, as well as treatment options and the need for additional tests if necessary. OBTAINING THE TEST RESULTS It is your responsibility to obtain your test results. Ask the lab or department performing the test when and how you will get your results. Document Released: 12/31/2004 Document Revised: 06/15/2011 Document Reviewed: 06/30/2013 [facility] Patient Information 2015 [place], [place]. This information is not intended to replace advice given to you by your health care provider. Make sure you discuss any questions you have with your health care provider.

## 2014-05-01 ENCOUNTER — Encounter: Payer: Self-pay | Admitting: Emergency Medicine

## 2014-05-01 LAB — CBC WITH DIFFERENTIAL/PLATELET
Basophils Absolute: 0 10*3/uL (ref 0.0–0.1)
Basophils Relative: 0 % (ref 0–1)
Eosinophils Absolute: 0.2 10*3/uL (ref 0.0–0.7)
Eosinophils Relative: 3 % (ref 0–5)
HCT: 35.6 % — ABNORMAL LOW (ref 36.0–46.0)
Hemoglobin: 11.5 g/dL — ABNORMAL LOW (ref 12.0–15.0)
LYMPHS ABS: 3.1 10*3/uL (ref 0.7–4.0)
Lymphocytes Relative: 39 % (ref 12–46)
MCH: 26.6 pg (ref 26.0–34.0)
MCHC: 32.3 g/dL (ref 30.0–36.0)
MCV: 82.4 fL (ref 78.0–100.0)
MPV: 9.3 fL (ref 8.6–12.4)
Monocytes Absolute: 0.5 10*3/uL (ref 0.1–1.0)
Monocytes Relative: 6 % (ref 3–12)
Neutro Abs: 4.1 10*3/uL (ref 1.7–7.7)
Neutrophils Relative %: 52 % (ref 43–77)
PLATELETS: 387 10*3/uL (ref 150–400)
RBC: 4.32 MIL/uL (ref 3.87–5.11)
RDW: 15.1 % (ref 11.5–15.5)
WBC: 7.9 10*3/uL (ref 4.0–10.5)

## 2014-05-01 LAB — IRON AND TIBC
%SAT: 6 % — ABNORMAL LOW (ref 20–55)
Iron: 21 ug/dL — ABNORMAL LOW (ref 42–145)
TIBC: 359 ug/dL (ref 250–470)
UIBC: 338 ug/dL (ref 125–400)

## 2014-05-01 LAB — BASIC METABOLIC PANEL WITH GFR
BUN: 15 mg/dL (ref 6–23)
CO2: 25 meq/L (ref 19–32)
Calcium: 9.5 mg/dL (ref 8.4–10.5)
Chloride: 103 mEq/L (ref 96–112)
Creat: 1.07 mg/dL (ref 0.50–1.10)
GFR, EST NON AFRICAN AMERICAN: 66 mL/min
GFR, Est African American: 76 mL/min
Glucose, Bld: 90 mg/dL (ref 70–99)
POTASSIUM: 3.6 meq/L (ref 3.5–5.3)
SODIUM: 136 meq/L (ref 135–145)

## 2014-05-01 LAB — URINALYSIS, MICROSCOPIC ONLY
CASTS: NONE SEEN
Crystals: NONE SEEN

## 2014-05-01 LAB — LIPID PANEL
CHOL/HDL RATIO: 3.5 ratio
CHOLESTEROL: 182 mg/dL (ref 0–200)
HDL: 52 mg/dL (ref >39)
LDL CALC: 100 mg/dL — AB (ref 0–99)
Triglycerides: 148 mg/dL (ref <150)
VLDL: 30 mg/dL (ref 0–40)

## 2014-05-01 LAB — MICROALBUMIN / CREATININE URINE RATIO
CREATININE, URINE: 259.6 mg/dL
MICROALB UR: 0.9 mg/dL (ref <2.0)
Microalb Creat Ratio: 3.5 mg/g (ref 0.0–30.0)

## 2014-05-01 LAB — INSULIN, FASTING: INSULIN FASTING, SERUM: 53.8 u[IU]/mL — AB (ref 2.0–19.6)

## 2014-05-01 LAB — HEPATIC FUNCTION PANEL
ALBUMIN: 3.9 g/dL (ref 3.5–5.2)
ALT: 14 U/L (ref 0–35)
AST: 14 U/L (ref 0–37)
Alkaline Phosphatase: 60 U/L (ref 39–117)
BILIRUBIN TOTAL: 0.3 mg/dL (ref 0.2–1.2)
Bilirubin, Direct: 0.1 mg/dL (ref 0.0–0.3)
Indirect Bilirubin: 0.2 mg/dL (ref 0.2–1.2)
TOTAL PROTEIN: 7.6 g/dL (ref 6.0–8.3)

## 2014-05-01 LAB — URINALYSIS, ROUTINE W REFLEX MICROSCOPIC
BILIRUBIN URINE: NEGATIVE
Glucose, UA: NEGATIVE mg/dL
Ketones, ur: NEGATIVE mg/dL
LEUKOCYTES UA: NEGATIVE
Nitrite: POSITIVE — AB
Protein, ur: NEGATIVE mg/dL
Specific Gravity, Urine: 1.017 (ref 1.005–1.030)
Urobilinogen, UA: 0.2 mg/dL (ref 0.0–1.0)
pH: 5 (ref 5.0–8.0)

## 2014-05-01 LAB — VITAMIN D 25 HYDROXY (VIT D DEFICIENCY, FRACTURES): Vit D, 25-Hydroxy: 22 ng/mL — ABNORMAL LOW (ref 30–100)

## 2014-05-01 LAB — VITAMIN B12: Vitamin B-12: 513 pg/mL (ref 211–911)

## 2014-05-01 LAB — FOLATE RBC: RBC Folate: 779 ng/mL (ref >280)

## 2014-05-01 LAB — MAGNESIUM: Magnesium: 1.8 mg/dL (ref 1.5–2.5)

## 2014-05-01 LAB — TSH: TSH: 1.775 u[IU]/mL (ref 0.350–4.500)

## 2014-05-03 LAB — URINE CULTURE: Colony Count: >=100000

## 2014-05-03 LAB — TB SKIN TEST
Induration: 0 mm
TB Skin Test: NEGATIVE

## 2014-05-05 ENCOUNTER — Other Ambulatory Visit: Payer: Self-pay | Admitting: Emergency Medicine

## 2014-05-05 MED ORDER — CIPROFLOXACIN HCL 500 MG PO TABS: 500.0000 mg | ORAL_TABLET | Freq: Two times a day (BID) | ORAL | Status: AC

## 2014-07-02 ENCOUNTER — Encounter: Payer: Self-pay | Admitting: *Deleted

## 2015-03-14 ENCOUNTER — Ambulatory Visit (INDEPENDENT_AMBULATORY_CARE_PROVIDER_SITE_OTHER): Payer: BLUE CROSS/BLUE SHIELD | Admitting: Internal Medicine

## 2015-03-14 ENCOUNTER — Encounter: Payer: Self-pay | Admitting: Internal Medicine

## 2015-03-14 VITALS — BP 156/72 | HR 74 | Temp 98.9°F | Resp 16 | Ht 62.0 in | Wt 186.8 lb

## 2015-03-14 DIAGNOSIS — B372 Candidiasis of skin and nail: Secondary | ICD-10-CM

## 2015-03-14 DIAGNOSIS — I10 Essential (primary) hypertension: Secondary | ICD-10-CM

## 2015-03-14 DIAGNOSIS — R0989 Other specified symptoms and signs involving the circulatory and respiratory systems: Secondary | ICD-10-CM

## 2015-03-14 MED ORDER — CLOTRIMAZOLE-BETAMETHASONE 1-0.05 % EX CREA: 1.0000 "application " | TOPICAL_CREAM | Freq: Two times a day (BID) | CUTANEOUS | Status: DC

## 2015-03-14 NOTE — Patient Instructions (Signed)
Cutaneous Candidiasis °Cutaneous candidiasis is a condition in which there is an overgrowth of yeast (candida) on the skin. Yeast normally live on the skin, but in small enough numbers not to cause any symptoms. In certain cases, increased growth of the yeast may cause an actual yeast infection. This kind of infection usually occurs in areas of the skin that are constantly warm and moist, such as the armpits or the groin. Yeast is the most common cause of diaper rash in babies and in people who cannot control their bowel movements (incontinence). °CAUSES  °The fungus that most often causes cutaneous candidiasis is Candida albicans. Conditions that can increase the risk of getting a yeast infection of the skin include: °· Obesity. °· Pregnancy. °· Diabetes. °· Taking antibiotic medicine. °· Taking birth control pills. °· Taking steroid medicines. °· Thyroid disease. °· An iron or zinc deficiency. °· Problems with the immune system. °SYMPTOMS  °· Red, swollen area of the skin. °· Bumps on the skin. °· Itchiness. °DIAGNOSIS  °The diagnosis of cutaneous candidiasis is usually based on its appearance. Light scrapings of the skin may also be taken and viewed under a microscope to identify the presence of yeast. °TREATMENT  °Antifungal creams may be applied to the infected skin. In severe cases, oral medicines may be needed.  °HOME CARE INSTRUCTIONS  °· Keep your skin clean and dry. °· Maintain a healthy weight. °· If you have diabetes, keep your blood sugar under control. °SEEK IMMEDIATE MEDICAL CARE IF: °· Your rash continues to spread despite treatment. °· You have a fever, chills, or abdominal pain. °  °This information is not intended to replace advice given to you by your health care [name]. Make sure you discuss any questions you have with your health care [name]. °  °Document Released: 12/09/2010 Document Revised: 06/15/2011 Document Reviewed: 09/24/2014 °Elsevier Interactive Patient Education ©2016 [employer] ° °

## 2015-03-14 NOTE — Progress Notes (Signed)
   Subjective:    Patient ID: [name], female    DOB: [birth year], 39 y.o.   MRN: [id number]  HPI  Patient reports to the office for evaluation of a rash on her right breast x 1 week.  She reports that it is very itchy.  She has been using A&D and also neosporin on it.  She reports that she used a different detergent recently but she has since gone back to the normal detergent.  She reports that it is now draining.  She reports that it is just wet looking.  She has never had a an issues with rashes in the past.  No breast swelling or pain.  No lumps.    Review of Systems  Constitutional: Negative for fever, chills and fatigue.  Gastrointestinal: Negative for nausea and vomiting.  Skin: Positive for rash.       Objective:   Physical Exam  Constitutional: She appears well-developed and well-nourished. No distress.  HENT:  Head: Normocephalic.  Mouth/Throat: Oropharynx is clear and moist. No oropharyngeal exudate.  Eyes: Conjunctivae are normal. No scleral icterus.  Neck: Normal range of motion. Neck supple. No JVD present. No thyromegaly present.  Cardiovascular: Normal rate, regular rhythm, normal heart sounds and intact distal pulses.  Exam reveals no gallop and no friction rub.   No murmur heard. Pulmonary/Chest: Right breast exhibits skin change. Right breast exhibits no inverted nipple, no mass, no nipple discharge and no tenderness. Breasts are symmetrical.    Abdominal: Soft. Bowel sounds are normal.  Lymphadenopathy:    She has no cervical adenopathy.  Skin: She is not diaphoretic.  Nursing note and vitals reviewed.   Filed Vitals:   03/14/15 1606  BP: 172/90  Pulse: 74  Temp: 98.9 F (37.2 C)  Resp: 16         Assessment & Plan:    1. Labile hypertension -monitor at home -if continued elevation to call office  2. Yeast dermatitis -lortisone cream

## 2015-04-25 ENCOUNTER — Encounter: Payer: Self-pay | Admitting: Emergency Medicine

## 2015-04-29 ENCOUNTER — Encounter: Payer: Self-pay | Admitting: Emergency Medicine

## 2015-05-01 ENCOUNTER — Encounter: Payer: Self-pay | Admitting: Emergency Medicine

## 2015-05-09 ENCOUNTER — Ambulatory Visit (INDEPENDENT_AMBULATORY_CARE_PROVIDER_SITE_OTHER): Payer: BLUE CROSS/BLUE SHIELD | Admitting: Internal Medicine

## 2015-05-09 ENCOUNTER — Encounter: Payer: Self-pay | Admitting: Internal Medicine

## 2015-05-09 VITALS — HR 74 | Temp 98.6°F | Resp 18 | Ht 62.75 in | Wt 181.0 lb

## 2015-05-09 DIAGNOSIS — F411 Generalized anxiety disorder: Secondary | ICD-10-CM

## 2015-05-09 DIAGNOSIS — J309 Allergic rhinitis, unspecified: Secondary | ICD-10-CM

## 2015-05-09 DIAGNOSIS — I10 Essential (primary) hypertension: Secondary | ICD-10-CM | POA: Diagnosis not present

## 2015-05-09 DIAGNOSIS — Z0000 Encounter for general adult medical examination without abnormal findings: Secondary | ICD-10-CM | POA: Diagnosis not present

## 2015-05-09 DIAGNOSIS — Z79899 Other long term (current) drug therapy: Secondary | ICD-10-CM | POA: Diagnosis not present

## 2015-05-09 DIAGNOSIS — R739 Hyperglycemia, unspecified: Secondary | ICD-10-CM

## 2015-05-09 DIAGNOSIS — K649 Unspecified hemorrhoids: Secondary | ICD-10-CM

## 2015-05-09 DIAGNOSIS — E559 Vitamin D deficiency, unspecified: Secondary | ICD-10-CM | POA: Diagnosis not present

## 2015-05-09 DIAGNOSIS — D649 Anemia, unspecified: Secondary | ICD-10-CM

## 2015-05-09 DIAGNOSIS — Z13220 Encounter for screening for lipoid disorders: Secondary | ICD-10-CM

## 2015-05-09 DIAGNOSIS — R0989 Other specified symptoms and signs involving the circulatory and respiratory systems: Secondary | ICD-10-CM

## 2015-05-09 DIAGNOSIS — J4520 Mild intermittent asthma, uncomplicated: Secondary | ICD-10-CM

## 2015-05-09 LAB — BASIC METABOLIC PANEL WITH GFR
BUN: 9 mg/dL (ref 7–25)
CHLORIDE: 103 mmol/L (ref 98–110)
CO2: 23 mmol/L (ref 20–31)
Calcium: 9.3 mg/dL (ref 8.6–10.2)
Creat: 1.01 mg/dL (ref 0.50–1.10)
GFR, Est African American: 81 mL/min (ref >=60)
GFR, Est Non African American: 70 mL/min (ref >=60)
Glucose, Bld: 80 mg/dL (ref 65–99)
POTASSIUM: 4.2 mmol/L (ref 3.5–5.3)
Sodium: 137 mmol/L (ref 135–146)

## 2015-05-09 LAB — CBC WITH DIFFERENTIAL/PLATELET
Basophils Absolute: 0 10*3/uL (ref 0.0–0.1)
Basophils Relative: 0 % (ref 0–1)
EOS ABS: 0.1 10*3/uL (ref 0.0–0.7)
Eosinophils Relative: 2 % (ref 0–5)
HCT: 32.8 % — ABNORMAL LOW (ref 36.0–46.0)
Hemoglobin: 10.6 g/dL — ABNORMAL LOW (ref 12.0–15.0)
LYMPHS ABS: 2.4 10*3/uL (ref 0.7–4.0)
Lymphocytes Relative: 44 % (ref 12–46)
MCH: 25.8 pg — AB (ref 26.0–34.0)
MCHC: 32.3 g/dL (ref 30.0–36.0)
MCV: 79.8 fL (ref 78.0–100.0)
MONO ABS: 0.3 10*3/uL (ref 0.1–1.0)
MONOS PCT: 5 % (ref 3–12)
MPV: 8.5 fL — ABNORMAL LOW (ref 8.6–12.4)
NEUTROS PCT: 49 % (ref 43–77)
Neutro Abs: 2.6 10*3/uL (ref 1.7–7.7)
PLATELETS: 425 10*3/uL — AB (ref 150–400)
RBC: 4.11 MIL/uL (ref 3.87–5.11)
RDW: 15.3 % (ref 11.5–15.5)
WBC: 5.4 10*3/uL (ref 4.0–10.5)

## 2015-05-09 LAB — URINALYSIS, ROUTINE W REFLEX MICROSCOPIC
BILIRUBIN URINE: NEGATIVE
Glucose, UA: NEGATIVE
Ketones, ur: NEGATIVE
LEUKOCYTES UA: NEGATIVE
Nitrite: NEGATIVE
PROTEIN: NEGATIVE
Specific Gravity, Urine: 1.008 (ref 1.001–1.035)
pH: 5.5 (ref 5.0–8.0)

## 2015-05-09 LAB — IRON AND TIBC
%SAT: 6 % — AB (ref 11–50)
Iron: 21 ug/dL — ABNORMAL LOW (ref 40–190)
TIBC: 367 ug/dL (ref 250–450)
UIBC: 346 ug/dL (ref 125–400)

## 2015-05-09 LAB — HEPATIC FUNCTION PANEL
ALK PHOS: 61 U/L (ref 33–115)
ALT: 12 U/L (ref 6–29)
AST: 15 U/L (ref 10–30)
Albumin: 4 g/dL (ref 3.6–5.1)
BILIRUBIN DIRECT: 0.1 mg/dL (ref <=0.2)
BILIRUBIN INDIRECT: 0.5 mg/dL (ref 0.2–1.2)
BILIRUBIN TOTAL: 0.6 mg/dL (ref 0.2–1.2)
Total Protein: 7.7 g/dL (ref 6.1–8.1)

## 2015-05-09 LAB — TSH: TSH: 1.276 u[IU]/mL (ref 0.350–4.500)

## 2015-05-09 LAB — URINALYSIS, MICROSCOPIC ONLY
Bacteria, UA: NONE SEEN [HPF]
CASTS: NONE SEEN [LPF]
CRYSTALS: NONE SEEN [HPF]
SQUAMOUS EPITHELIAL / LPF: NONE SEEN [HPF] (ref <=5)
YEAST: NONE SEEN [HPF]

## 2015-05-09 LAB — LIPID PANEL
CHOL/HDL RATIO: 3.6 ratio (ref <=5.0)
Cholesterol: 183 mg/dL (ref 125–200)
HDL: 51 mg/dL (ref >=46)
LDL CALC: 121 mg/dL (ref <130)
TRIGLYCERIDES: 56 mg/dL (ref <150)
VLDL: 11 mg/dL (ref <30)

## 2015-05-09 LAB — MAGNESIUM: Magnesium: 1.8 mg/dL (ref 1.5–2.5)

## 2015-05-09 LAB — HEMOGLOBIN A1C
HEMOGLOBIN A1C: 5.8 % — AB (ref <5.7)
Mean Plasma Glucose: 120 mg/dL — ABNORMAL HIGH (ref <117)

## 2015-05-09 LAB — VITAMIN B12: VITAMIN B 12: 548 pg/mL (ref 211–911)

## 2015-05-09 MED ORDER — SERTRALINE HCL 50 MG PO TABS: ORAL_TABLET | ORAL | Status: DC

## 2015-05-09 MED ORDER — ALPRAZOLAM 0.5 MG PO TABS: 0.5000 mg | ORAL_TABLET | Freq: Two times a day (BID) | ORAL | Status: DC | PRN

## 2015-05-09 MED ORDER — HYDROCORTISONE ACETATE 25 MG RE SUPP: 25.0000 mg | Freq: Two times a day (BID) | RECTAL | Status: DC

## 2015-05-09 NOTE — Patient Instructions (Addendum)
Dr. Arbutus Leas Therapist [phone].   Sertraline tablets What is this medicine? SERTRALINE (SER tra leen) is used to treat depression. It may also be used to treat obsessive compulsive disorder, panic disorder, post-trauma stress, premenstrual dysphoric disorder (PMDD) or social anxiety. This medicine may be used for other purposes; ask your health care provider or pharmacist if you have questions. What should I tell my health care provider before I take this medicine? They need to know if you have any of these conditions: -bipolar disorder or a family history of bipolar disorder -diabetes -glaucoma -heart disease -high blood pressure -history of irregular heartbeat -history of low levels of calcium, magnesium, or potassium in the blood -if you often drink alcohol -liver disease -receiving electroconvulsive therapy -seizures -suicidal thoughts, plans, or attempt; a previous suicide attempt by you or a family member -thyroid disease -an unusual or allergic reaction to sertraline, other medicines, foods, dyes, or preservatives -pregnant or trying to get pregnant -breast-feeding How should I use this medicine? Take this medicine by mouth with a glass of water. Follow the directions on the prescription label. You can take it with or without food. Take your medicine at regular intervals. Do not take your medicine more often than directed. Do not stop taking this medicine suddenly except upon the advice of your doctor. Stopping this medicine too quickly may cause serious side effects or your condition may worsen. A special MedGuide will be given to you by the pharmacist with each prescription and refill. Be sure to read this information carefully each time. Talk to your pediatrician regarding the use of this medicine in children. While this drug may be prescribed for children as young as 7 years for selected conditions, precautions do apply. Overdosage: If you think you have taken too much  of this medicine contact a poison control center or emergency room at once. NOTE: This medicine is only for you. Do not share this medicine with others. What if I miss a dose? If you miss a dose, take it as soon as you can. If it is almost time for your next dose, take only that dose. Do not take double or extra doses. What may interact with this medicine? Do not take this medicine with any of the following medications: -certain medicines for fungal infections like fluconazole, itraconazole, ketoconazole, posaconazole, voriconazole -cisapride -disulfiram -dofetilide -linezolid -MAOIs like Carbex, Eldepryl, Marplan, Nardil, and Parnate -metronidazole -methylene blue (injected into a vein) -pimozide -thioridazine -ziprasidone This medicine may also interact with the following medications: -alcohol -aspirin and aspirin-like medicines -certain medicines for depression, anxiety, or psychotic disturbances -certain medicines for irregular heart beat like flecainide, propafenone -certain medicines for migraine headaches like almotriptan, eletriptan, frovatriptan, naratriptan, rizatriptan, sumatriptan, zolmitriptan -certain medicines for sleep -certain medicines for seizures like carbamazepine, valproic acid, phenytoin -certain medicines that treat or prevent blood clots like warfarin, enoxaparin, dalteparin -cimetidine -digoxin -diuretics -fentanyl -furazolidone -isoniazid -lithium -NSAIDs, medicines for pain and inflammation, like ibuprofen or naproxen -other medicines that prolong the QT interval (cause an abnormal heart rhythm) -procarbazine -rasagiline -supplements like St. John's wort, kava kava, valerian -tolbutamide -tramadol -tryptophan This list may not describe all possible interactions. Give your health care provider a list of all the medicines, herbs, non-prescription drugs, or dietary supplements you use. Also tell them if you smoke, drink alcohol, or use illegal drugs.  Some items may interact with your medicine. What should I watch for while using this medicine? Tell your doctor if your symptoms do not get better or  if they get worse. Visit your doctor or health care professional for regular checks on your progress. Because it may take several weeks to see the full effects of this medicine, it is important to continue your treatment as prescribed by your doctor. Patients and their families should watch out for new or worsening thoughts of suicide or depression. Also watch out for sudden changes in feelings such as feeling anxious, agitated, panicky, irritable, hostile, aggressive, impulsive, severely restless, overly excited and hyperactive, or not being able to sleep. If this happens, especially at the beginning of treatment or after a change in dose, call your health care professional. Dennis Bast may get drowsy or dizzy. Do not drive, use machinery, or do anything that needs mental alertness until you know how this medicine affects you. Do not stand or sit up quickly, especially if you are an older patient. This reduces the risk of dizzy or fainting spells. Alcohol may interfere with the effect of this medicine. Avoid alcoholic drinks. Your mouth may get dry. Chewing sugarless gum or sucking hard candy, and drinking plenty of water may help. Contact your doctor if the problem does not go away or is severe. What side effects may I notice from receiving this medicine? Side effects that you should report to your doctor or health care professional as soon as possible: -allergic reactions like skin rash, itching or hives, swelling of the face, lips, or tongue -black or bloody stools, blood in the urine or vomit -fast, irregular heartbeat -feeling faint or lightheaded, falls -hallucination, loss of contact with reality -seizures -suicidal thoughts or other mood changes -unusual bleeding or bruising -unusually weak or tired -vomiting Side effects that usually do not require  medical attention (report to your doctor or health care professional if they continue or are bothersome): -change in appetite -change in sex drive or performance -diarrhea -increased sweating -indigestion, nausea -tremors This list may not describe all possible side effects. Call your doctor for medical advice about side effects. You may report side effects to FDA at 1-800-FDA-1088. Where should I keep my medicine? Keep out of the reach of children. Store at room temperature between 15 and 30 degrees C (59 and 86 degrees F). Throw away any unused medicine after the expiration date. NOTE: This sheet is a summary. It may not cover all possible information. If you have questions about this medicine, talk to your doctor, pharmacist, or health care provider.    2016, Elsevier/Gold Standard. (2012-10-18 12:57:35)

## 2015-05-09 NOTE — Progress Notes (Signed)
Patient ID: [name], female   DOB: Jul 19, 1975, 40 y.o.   MRN: [id number]    Annual Screening Comprehensive Examination   This very nice 40 y.o.female presents for complete physical.  Patient has no major health issues.  Patient reports no complaints at this time.   She reports that she has been having some hemorrhoids.  She reports that she already had a colonoscopy.  She reports that she hasn't been using anything on them.  She used to have to strain a lot to have bowel movements but this has improved.    Finally, patient has history of Vitamin D Deficiency and last vitamin D was  Lab Results  Component Value Date   VD25OH 22* 04/30/2014  .  Currently on supplementation  Patient reports that she has been having some anxiety which has been going on for the last 2 years but in the past few months she feels like it has gotten a lot worse.  She feels like it is affecting her sleep and she is having hard time falling asleep and hard time staying asleep.  She reports that her appetite has diminished.  She is drinking a lot more water and tried deep breathing but that doesn't help a lot.    She is limited due to time for exercise but she does try to get it in twice a week.    She is seeing Obgyn currently.  She sees Dr. Raphael Gibney in April every year.     Current Outpatient Prescriptions on File Prior to Visit  Medication Sig Dispense Refill  . Cholecalciferol (VITAMIN D PO) Take 1 tablet by mouth daily.    . clotrimazole-betamethasone (LOTRISONE) cream Apply 1 application topically 2 (two) times daily. 45 g 0   No current facility-administered medications on file prior to visit.    Allergies  Allergen Reactions  . Zyrtec [Cetirizine] Other (See Comments)    Jittery.    Past Medical History  Diagnosis Date  . Allergic rhinitis   . Spastic colon   . Anemia   . Asthma   . Hyperlipidemia   . Vitamin D deficiency   . Labile hypertension   . Labile hypertension   . H. pylori  infection 2015    treated  . Blood glucose elevated     Immunization History  Administered Date(s) Administered  . Influenza Whole 01/26/2012  . PPD Test 04/30/2014  . Tdap 02/05/2009, 03/12/2011    Past Surgical History  Procedure Laterality Date  . Colposcopy    . Colonoscopy w/ polypectomy  2014    Family History  Problem Relation Age of Onset  . Hypertension Father   . Diabetes Father   . Cancer Father     prostate  . Stroke Maternal Grandmother   . Breast cancer Paternal Aunt   . Cancer Paternal Aunt     breast  . Hypertension Mother   . Arthritis Mother   . Stroke Mother   . Kidney disease Maternal Grandfather   . Stroke Paternal Grandfather     Social History   Social History  . Marital Status: Single    Spouse Name: N/A  . Number of Children: 1  . Years of Education: N/A   Occupational History  .  [employer]   Social History Main Topics  . Smoking status: Never Smoker   . Smokeless tobacco: Never Used  . Alcohol Use: No  . Drug Use: No  . Sexual Activity: Yes    Birth  Control/ Protection: Condom   Other Topics Concern  . Not on file   Social History Narrative   Daily caffeine    Review of Systems  Constitutional: Negative for fever, chills and malaise/fatigue.  HENT: Negative for congestion, ear pain and sore throat.   Respiratory: Negative for cough, shortness of breath and wheezing.   Cardiovascular: Negative for chest pain, palpitations and leg swelling.  Gastrointestinal: Positive for blood in stool. Negative for heartburn, diarrhea, constipation and melena.  Genitourinary: Negative.   Skin: Positive for rash.  Neurological: Negative for dizziness, sensory change, loss of consciousness and headaches.  Psychiatric/Behavioral: Negative for depression. The patient is nervous/anxious and has insomnia.       Physical Exam  Ht 5' 2.75" (1.594 m)  Wt 181 lb (82.101 kg)  BMI 32.31 kg/m2  LMP 05/02/2015  General Appearance: Well  nourished and in no apparent distress. Eyes: PERRLA, EOMs, conjunctiva no swelling or erythema, normal fundi and vessels. Sinuses: No frontal/maxillary tenderness ENT/Mouth: EACs patent / TMs  nl. Nares clear without erythema, swelling, mucoid exudates. Oral hygiene is good. No erythema, swelling, or exudate. Tongue normal, non-obstructing. Tonsils not swollen or erythematous. Hearing normal.  Neck: Supple, thyroid normal. No bruits, nodes or JVD. Respiratory: Respiratory effort normal.  BS equal and clear bilateral without rales, rhonci, wheezing or stridor. Cardio: Heart sounds are normal with regular rate and rhythm and no murmurs, rubs or gallops. Peripheral pulses are normal and equal bilaterally without edema. No aortic or femoral bruits. Chest: symmetric with normal excursions and percussion. Breasts: Symmetric, without lumps, nipple discharge, retractions, or fibrocystic changes.  Abdomen: Flat, soft, with bowl sounds. Nontender, no guarding, rebound, hernias, masses, or organomegaly.  Lymphatics: Non tender without lymphadenopathy.  Genitourinary:  Musculoskeletal: Full ROM all peripheral extremities, joint stability, 5/5 strength, and normal gait. Skin: Warm and dry without rashes, lesions, cyanosis, clubbing or  ecchymosis.  Neuro: Cranial nerves intact, reflexes equal bilaterally. Normal muscle tone, no cerebellar symptoms. Sensation intact.  Pysch: Awake and oriented X 3, normal affect, Insight and Judgment appropriate.   Assessment and Plan    1. Labile hypertension  - Urinalysis, Routine w reflex microscopic (not at [facility]) - Microalbumin / creatinine urine ratio - EKG 12-Lead - TSH  2. Elevated blood sugar  - Hemoglobin A1c - Insulin, random  3. Screening for hyperlipidemia  - Lipid panel  4. Asthma, mild intermittent, uncomplicated -well controlled currently  5. Allergic rhinitis, unspecified allergic rhinitis type -cont otc meds prn  6. Anemia, unspecified  anemia type  - Iron and TIBC - Vitamin B12  7. Vitamin D deficiency  - VITAMIN D 25 Hydroxy (Vit-D Deficiency, Fractures)  8. Medication management  - CBC with Differential/Platelet - BASIC METABOLIC PANEL WITH GFR - Hepatic function panel - Magnesium  9. Hemorrhoids, unspecified hemorrhoid type -annusol suppository  10. Generalized anxiety disorder -zoloft  -xanax as rescue -followup 4-6 weeks     Continue prudent diet as discussed, weight control, regular exercise, and medications. Routine screening labs and tests as requested with regular follow-up as recommended.  Over 40 minutes of exam, counseling, chart review and critical decision making was performed

## 2015-05-10 LAB — VITAMIN D 25 HYDROXY (VIT D DEFICIENCY, FRACTURES): VIT D 25 HYDROXY: 19 ng/mL — AB (ref 30–100)

## 2015-05-10 LAB — INSULIN, RANDOM: Insulin: 8.7 u[IU]/mL (ref 2.0–19.6)

## 2015-05-10 LAB — MICROALBUMIN / CREATININE URINE RATIO
CREATININE, URINE: 51 mg/dL (ref 20–320)
MICROALB/CREAT RATIO: 4 ug/mg{creat} (ref <30)
Microalb, Ur: 0.2 mg/dL

## 2015-06-13 ENCOUNTER — Ambulatory Visit (INDEPENDENT_AMBULATORY_CARE_PROVIDER_SITE_OTHER): Payer: BLUE CROSS/BLUE SHIELD | Admitting: Internal Medicine

## 2015-06-13 ENCOUNTER — Encounter: Payer: Self-pay | Admitting: Internal Medicine

## 2015-06-13 VITALS — BP 164/108 | HR 88 | Temp 98.0°F | Resp 16 | Ht 62.75 in

## 2015-06-13 DIAGNOSIS — F411 Generalized anxiety disorder: Secondary | ICD-10-CM

## 2015-06-13 DIAGNOSIS — I10 Essential (primary) hypertension: Secondary | ICD-10-CM | POA: Diagnosis not present

## 2015-06-13 MED ORDER — SERTRALINE HCL 50 MG PO TABS: 50.0000 mg | ORAL_TABLET | Freq: Every day | ORAL | Status: DC

## 2015-06-13 MED ORDER — BISOPROLOL-HYDROCHLOROTHIAZIDE 5-6.25 MG PO TABS: 1.0000 | ORAL_TABLET | Freq: Every day | ORAL | Status: DC

## 2015-06-13 NOTE — Patient Instructions (Signed)
Bisoprolol; Hydrochlorothiazide, HCTZ tablets  What is this medicine?  BISOPROLOL; HYDROCHLOROTHIAZIDE (bis OH proe lol; hye droe klor oh THYE a zide) is a combination of a beta-blocker and a diuretic. It is used to treat high blood pressure.  This medicine may be used for other purposes; ask your health care provider or pharmacist if you have questions.  What should I tell my health care provider before I take this medicine?  They need to know if you have any of these conditions:  -circulation problems, or blood vessel disease  -decreased urine  -diabetes  -heart disease, heart failure or a history of heart attack  -kidney disease  -liver disease  -lung or breathing disease, like asthma  -slow heart rate  -thyroid disease  -an unusual or allergic reaction to hydrochlorothiazide, bisoprolol, sulfa drugs, other medicines, foods, dyes, or preservatives  -pregnant or trying to get pregnant  -breast-feeding  How should I use this medicine?  Take this medicine by mouth with a glass of water. Follow the directions on the prescription label. You can take it with or without food. If it upsets your stomach, take it with food. Take your medicine at regular intervals. Do not take it more often than directed. Do not stop taking except on your doctor's advice.  Talk to your pediatrician regarding the use of this medicine in children. Special care may be needed.  Overdosage: If you think you have taken too much of this medicine contact a poison control center or emergency room at once.  NOTE: This medicine is only for you. Do not share this medicine with others.  What if I miss a dose?  If you miss a dose, take it as soon as you can. If it is almost time for your next dose, take only that dose. Do not take double or extra doses.  What may interact with this medicine?  -barbiturates, like phenobarbital  -cholestyramine  -colestipol  -corticosteroids, like prednisone  -lithium  -medicines for chest pain or angina  -medicines for  diabetes  -medicines for high blood pressure or heart failure  -medicines to control heart rhythm  -NSAIDs, medicines for pain and inflammation, like ibuprofen or naproxen  -prescription pain medicines  -rifampin  -skeletal muscle relaxants like tubocurarine  This list may not describe all possible interactions. Give your health care provider a list of all the medicines, herbs, non-prescription drugs, or dietary supplements you use. Also tell them if you smoke, drink alcohol, or use illegal drugs. Some items may interact with your medicine.  What should I watch for while using this medicine?  Visit your doctor or health care professional for regular checks on your progress. Check your blood pressure as directed. Ask your doctor or health care professional what your blood pressure should be and when you should contact him or her.  Check with your doctor or health care professional if you get an attack of severe diarrhea, nausea and vomiting, or if you sweat a lot. The loss of too much body fluid can make it dangerous for you to take this medicine.  You may get drowsy or dizzy. Do not drive, use machinery, or do anything that needs mental alertness until you know how this drug affects you. Do not stand or sit up quickly, especially if you are an older patient. This reduces the risk of dizzy or fainting spells. Alcohol can make you more drowsy and dizzy. Avoid alcoholic drinks.  This medicine may affect your blood sugar level. If you   have diabetes, check with your doctor or health care professional before changing the dose of your diabetic medicine.  This medicine can make you more sensitive to the sun. Keep out of the sun. If you cannot avoid being in the sun, wear protective clothing and use sunscreen. Do not use sun lamps or tanning beds/booths.  Do not treat yourself for coughs, colds, or pain while you are taking this medicine without asking your doctor or health care professional for advice. Some ingredients may  increase your blood pressure.  What side effects may I notice from receiving this medicine?  Side effects that you should report to your doctor or health care professional as soon as possible:  -allergic reactions like skin rash, itching or hives, swelling of the face, lips, or tongue  -breathing problems  -changes in vision  -chest pain  -cold, tingling, or numb hands or feet  -eye pain  -fast, irregular, or slow heartbeat  -increased thirst or sweating  -muscle cramps  -redness, blistering, peeling or loosening of the skin, including inside the mouth  -swollen legs or ankles  -tremors  -unusual bruising  -unusual weak or tired  -vomiting  -worsened gout pain  -yellowing of the eyes or skin  Side effects that usually do not require medical attention (report to your doctor or health care professional if they continue or are bothersome):  -change in sex drive or performance  -cough  -depression  -diarrhea  -nausea  This list may not describe all possible side effects. Call your doctor for medical advice about side effects. You may report side effects to FDA at 1-800-FDA-1088.  Where should I keep my medicine?  Keep out of the reach of children.  Store at room temperature between 15 and 30 degrees C (59 and 86 degrees F). Keep container tightly closed. Throw away any unused medicine after the expiration date.  NOTE: This sheet is a summary. It may not cover all possible information. If you have questions about this medicine, talk to your doctor, pharmacist, or health care provider.     © 2016, Elsevier/Gold Standard. (2009-12-11 12:53:54)

## 2015-06-13 NOTE — Progress Notes (Signed)
Assessment and Plan:   1. Essential hypertension -ziac -monitor at home -mychart message x 1 week  2. Generalized anxiety disorder -cont zoloft -cont xanax prn for anxiety    HPI 40 y.o.female presents for 1 month follow up of anxiety and htn.  She reports that the zoloft and the xanax are helping a lot.  She reports that she has only had one panic attack since her last visit.  Patient reports that they have been doing well.  female is not taking their medication.  They are not having difficulty with their medications.  They report no adverse reactions.    She has been having high blood pressure at home.  She reports that it has been running higher at home than it is today. NO CP, SOB, blurred vision, headaches, decreased U/O.  Past Medical History  Diagnosis Date  . Allergic rhinitis   . Spastic colon   . Anemia   . Asthma   . Hyperlipidemia   . Vitamin D deficiency   . Labile hypertension   . Labile hypertension   . H. pylori infection 2015    treated  . Blood glucose elevated      Allergies  Allergen Reactions  . Zyrtec [Cetirizine] Other (See Comments)    Jittery.      Current Outpatient Prescriptions on File Prior to Visit  Medication Sig Dispense Refill  . ALPRAZolam (XANAX) 0.5 MG tablet Take 1 tablet (0.5 mg total) by mouth 2 (two) times daily as needed for anxiety or sleep. 60 tablet 0  . Cholecalciferol (VITAMIN D PO) Take 1 tablet by mouth daily.    . clotrimazole-betamethasone (LOTRISONE) cream Apply 1 application topically 2 (two) times daily. 45 g 0  . hydrocortisone (ANUSOL-HC) 25 MG suppository Place 1 suppository (25 mg total) rectally 2 (two) times daily. For 7 days 28 suppository 0  . sertraline (ZOLOFT) 50 MG tablet Take 1/2 tablet with food daily. If you have no side effects after 1 week please increase to a whole tablet. 30 tablet 2   No current facility-administered medications on file prior to visit.    ROS: all negative except above.    Physical Exam: There were no vitals filed for this visit. BP 164/108 mmHg  Pulse 88  Temp(Src) 98 F (36.7 C) (Temporal)  Resp 16  Ht 5' 2.75" (1.594 m)  LMP 05/30/2015 General Appearance: Well developed well nourished, non-toxic appearing in no apparent distress. Eyes: PERRLA, EOMs, conjunctiva w/ no swelling or erythema or discharge Sinuses: No Frontal/maxillary tenderness ENT/Mouth: Ear canals clear without swelling or erythema.  TM's normal bilaterally with no retractions, bulging, or loss of landmarks.   Neck: Supple, thyroid normal, no notable JVD  Respiratory: Respiratory effort normal, Clear breath sounds anteriorly and posteriorly bilaterally without rales, rhonchi, wheezing or stridor. No retractions or accessory muscle usage. Cardio: RRR with no MRGs.   Abdomen: Soft, + BS.  Non tender, no guarding, rebound, hernias, masses.  Musculoskeletal: Full ROM, 5/5 strength, normal gait.  Skin: Warm, dry without rashes  Neuro: Awake and oriented X 3, Cranial nerves intact. Normal muscle tone, no cerebellar symptoms. Sensation intact.  Psych: normal affect, Insight and Judgment appropriate.     Starlyn Skeans, PA-C 2:51 PM [facility]

## 2015-07-18 ENCOUNTER — Encounter (HOSPITAL_COMMUNITY): Payer: Self-pay | Admitting: Emergency Medicine

## 2015-07-18 ENCOUNTER — Emergency Department (HOSPITAL_COMMUNITY): Payer: BLUE CROSS/BLUE SHIELD

## 2015-07-18 ENCOUNTER — Emergency Department (HOSPITAL_COMMUNITY): Payer: BLUE CROSS/BLUE SHIELD | Admitting: Anesthesiology

## 2015-07-18 ENCOUNTER — Other Ambulatory Visit: Payer: Self-pay | Admitting: Internal Medicine

## 2015-07-18 ENCOUNTER — Encounter (HOSPITAL_COMMUNITY): Admission: EM | Disposition: A | Discharge status: Rehabilitation Facility | Payer: Self-pay | Source: Home / Self Care

## 2015-07-18 ENCOUNTER — Other Ambulatory Visit: Payer: Self-pay | Admitting: Plastic Surgery

## 2015-07-18 ENCOUNTER — Inpatient Hospital Stay (HOSPITAL_COMMUNITY): Payer: BLUE CROSS/BLUE SHIELD

## 2015-07-18 ENCOUNTER — Inpatient Hospital Stay (HOSPITAL_COMMUNITY)
Admission: EM | Admit: 2015-07-18 | Discharge: 2015-07-24 | DRG: 463 | Disposition: A | Discharge status: Rehabilitation Facility | Payer: BLUE CROSS/BLUE SHIELD | Attending: General Surgery | Admitting: General Surgery

## 2015-07-18 DIAGNOSIS — E785 Hyperlipidemia, unspecified: Secondary | ICD-10-CM | POA: Diagnosis present

## 2015-07-18 DIAGNOSIS — S0292XA Unspecified fracture of facial bones, initial encounter for closed fracture: Secondary | ICD-10-CM

## 2015-07-18 DIAGNOSIS — K5903 Drug induced constipation: Secondary | ICD-10-CM | POA: Diagnosis not present

## 2015-07-18 DIAGNOSIS — S199XXA Unspecified injury of neck, initial encounter: Secondary | ICD-10-CM | POA: Diagnosis present

## 2015-07-18 DIAGNOSIS — S01512A Laceration without foreign body of oral cavity, initial encounter: Secondary | ICD-10-CM | POA: Diagnosis present

## 2015-07-18 DIAGNOSIS — Z79899 Other long term (current) drug therapy: Secondary | ICD-10-CM

## 2015-07-18 DIAGNOSIS — J45909 Unspecified asthma, uncomplicated: Secondary | ICD-10-CM | POA: Diagnosis present

## 2015-07-18 DIAGNOSIS — D62 Acute posthemorrhagic anemia: Secondary | ICD-10-CM | POA: Diagnosis not present

## 2015-07-18 DIAGNOSIS — S12001A Unspecified nondisplaced fracture of first cervical vertebra, initial encounter for closed fracture: Secondary | ICD-10-CM | POA: Diagnosis present

## 2015-07-18 DIAGNOSIS — K5900 Constipation, unspecified: Secondary | ICD-10-CM | POA: Diagnosis present

## 2015-07-18 DIAGNOSIS — S022XXA Fracture of nasal bones, initial encounter for closed fracture: Secondary | ICD-10-CM | POA: Diagnosis present

## 2015-07-18 DIAGNOSIS — S12000A Unspecified displaced fracture of first cervical vertebra, initial encounter for closed fracture: Secondary | ICD-10-CM | POA: Diagnosis present

## 2015-07-18 DIAGNOSIS — Y92410 Unspecified street and highway as the place of occurrence of the external cause: Secondary | ICD-10-CM

## 2015-07-18 DIAGNOSIS — S41112D Laceration without foreign body of left upper arm, subsequent encounter: Secondary | ICD-10-CM | POA: Diagnosis not present

## 2015-07-18 DIAGNOSIS — S01112A Laceration without foreign body of left eyelid and periocular area, initial encounter: Secondary | ICD-10-CM | POA: Diagnosis present

## 2015-07-18 DIAGNOSIS — R000 Tachycardia, unspecified: Secondary | ICD-10-CM | POA: Diagnosis not present

## 2015-07-18 DIAGNOSIS — S025XXS Fracture of tooth (traumatic), sequela: Secondary | ICD-10-CM | POA: Diagnosis not present

## 2015-07-18 DIAGNOSIS — S12090S Other displaced fracture of first cervical vertebra, sequela: Secondary | ICD-10-CM | POA: Diagnosis not present

## 2015-07-18 DIAGNOSIS — R609 Edema, unspecified: Secondary | ICD-10-CM | POA: Diagnosis not present

## 2015-07-18 DIAGNOSIS — S032XXA Dislocation of tooth, initial encounter: Secondary | ICD-10-CM | POA: Diagnosis present

## 2015-07-18 DIAGNOSIS — F329 Major depressive disorder, single episode, unspecified: Secondary | ICD-10-CM | POA: Diagnosis present

## 2015-07-18 DIAGNOSIS — S0181XA Laceration without foreign body of other part of head, initial encounter: Secondary | ICD-10-CM | POA: Diagnosis not present

## 2015-07-18 DIAGNOSIS — S12001D Unspecified nondisplaced fracture of first cervical vertebra, subsequent encounter for fracture with routine healing: Secondary | ICD-10-CM | POA: Diagnosis not present

## 2015-07-18 DIAGNOSIS — K148 Other diseases of tongue: Secondary | ICD-10-CM | POA: Diagnosis not present

## 2015-07-18 DIAGNOSIS — R030 Elevated blood-pressure reading, without diagnosis of hypertension: Secondary | ICD-10-CM | POA: Diagnosis not present

## 2015-07-18 DIAGNOSIS — J9600 Acute respiratory failure, unspecified whether with hypoxia or hypercapnia: Secondary | ICD-10-CM

## 2015-07-18 DIAGNOSIS — S51822A Laceration with foreign body of left forearm, initial encounter: Secondary | ICD-10-CM | POA: Diagnosis present

## 2015-07-18 DIAGNOSIS — J9690 Respiratory failure, unspecified, unspecified whether with hypoxia or hypercapnia: Secondary | ICD-10-CM | POA: Diagnosis not present

## 2015-07-18 DIAGNOSIS — S0181XS Laceration without foreign body of other part of head, sequela: Secondary | ICD-10-CM | POA: Diagnosis not present

## 2015-07-18 DIAGNOSIS — S12000S Unspecified displaced fracture of first cervical vertebra, sequela: Secondary | ICD-10-CM | POA: Diagnosis not present

## 2015-07-18 DIAGNOSIS — S0101XA Laceration without foreign body of scalp, initial encounter: Secondary | ICD-10-CM | POA: Diagnosis present

## 2015-07-18 DIAGNOSIS — R52 Pain, unspecified: Secondary | ICD-10-CM | POA: Diagnosis not present

## 2015-07-18 DIAGNOSIS — I10 Essential (primary) hypertension: Secondary | ICD-10-CM | POA: Diagnosis present

## 2015-07-18 DIAGNOSIS — F32A Depression, unspecified: Secondary | ICD-10-CM | POA: Insufficient documentation

## 2015-07-18 DIAGNOSIS — S01511A Laceration without foreign body of lip, initial encounter: Secondary | ICD-10-CM | POA: Diagnosis present

## 2015-07-18 DIAGNOSIS — T1490XA Injury, unspecified, initial encounter: Secondary | ICD-10-CM

## 2015-07-18 DIAGNOSIS — S02631A Fracture of coronoid process of right mandible, initial encounter for closed fracture: Secondary | ICD-10-CM | POA: Diagnosis present

## 2015-07-18 DIAGNOSIS — Z9911 Dependence on respirator [ventilator] status: Secondary | ICD-10-CM | POA: Diagnosis not present

## 2015-07-18 DIAGNOSIS — T884XXA Failed or difficult intubation, initial encounter: Secondary | ICD-10-CM

## 2015-07-18 DIAGNOSIS — J988 Other specified respiratory disorders: Secondary | ICD-10-CM | POA: Diagnosis not present

## 2015-07-18 DIAGNOSIS — T148 Other injury of unspecified body region: Secondary | ICD-10-CM | POA: Diagnosis not present

## 2015-07-18 DIAGNOSIS — F4323 Adjustment disorder with mixed anxiety and depressed mood: Secondary | ICD-10-CM | POA: Diagnosis not present

## 2015-07-18 HISTORY — PX: I&D EXTREMITY: SHX5045

## 2015-07-18 HISTORY — PX: LACERATION REPAIR: SHX5284

## 2015-07-18 HISTORY — DX: Unspecified fracture of facial bones, initial encounter for closed fracture: S02.92XA

## 2015-07-18 LAB — COMPREHENSIVE METABOLIC PANEL
ALK PHOS: 54 U/L (ref 38–126)
ALT: 18 U/L (ref 14–54)
AST: 36 U/L (ref 15–41)
Albumin: 3.5 g/dL (ref 3.5–5.0)
Anion gap: 13 (ref 5–15)
BUN: 11 mg/dL (ref 6–20)
CALCIUM: 9 mg/dL (ref 8.9–10.3)
CHLORIDE: 104 mmol/L (ref 101–111)
CO2: 17 mmol/L — AB (ref 22–32)
CREATININE: 1.1 mg/dL — AB (ref 0.44–1.00)
Glucose, Bld: 158 mg/dL — ABNORMAL HIGH (ref 65–99)
Potassium: 3.9 mmol/L (ref 3.5–5.1)
Sodium: 134 mmol/L — ABNORMAL LOW (ref 135–145)
Total Bilirubin: 0.6 mg/dL (ref 0.3–1.2)
Total Protein: 7.1 g/dL (ref 6.5–8.1)

## 2015-07-18 LAB — PREPARE FRESH FROZEN PLASMA
UNIT DIVISION: 0
Unit division: 0

## 2015-07-18 LAB — I-STAT CG4 LACTIC ACID, ED: Lactic Acid, Venous: 3.23 mmol/L (ref 0.5–2.0)

## 2015-07-18 LAB — I-STAT CHEM 8, ED
BUN: 12 mg/dL (ref 6–20)
CALCIUM ION: 0.97 mmol/L — AB (ref 1.12–1.23)
CHLORIDE: 108 mmol/L (ref 101–111)
CREATININE: 1 mg/dL (ref 0.44–1.00)
Glucose, Bld: 151 mg/dL — ABNORMAL HIGH (ref 65–99)
HCT: 37 % (ref 36.0–46.0)
Hemoglobin: 12.6 g/dL (ref 12.0–15.0)
Potassium: 3.9 mmol/L (ref 3.5–5.1)
Sodium: 136 mmol/L (ref 135–145)
TCO2: 17 mmol/L (ref 0–100)

## 2015-07-18 LAB — ETHANOL

## 2015-07-18 LAB — TYPE AND SCREEN
ABO/RH(D): B POS
Antibody Screen: NEGATIVE
UNIT DIVISION: 0
UNIT DIVISION: 0

## 2015-07-18 LAB — LIPASE, BLOOD: Lipase: 26 U/L (ref 11–51)

## 2015-07-18 LAB — CBC
HEMATOCRIT: 29.4 % — AB (ref 36.0–46.0)
HEMOGLOBIN: 9.5 g/dL — AB (ref 12.0–15.0)
MCH: 26 pg (ref 26.0–34.0)
MCHC: 32.3 g/dL (ref 30.0–36.0)
MCV: 80.5 fL (ref 78.0–100.0)
Platelets: 325 10*3/uL (ref 150–400)
RBC: 3.65 MIL/uL — ABNORMAL LOW (ref 3.87–5.11)
RDW: 14.9 % (ref 11.5–15.5)
WBC: 16.9 10*3/uL — AB (ref 4.0–10.5)

## 2015-07-18 LAB — POCT I-STAT 4, (NA,K, GLUC, HGB,HCT)
Glucose, Bld: 141 mg/dL — ABNORMAL HIGH (ref 65–99)
HCT: 33 % — ABNORMAL LOW (ref 36.0–46.0)
HEMOGLOBIN: 11.2 g/dL — AB (ref 12.0–15.0)
POTASSIUM: 3.7 mmol/L (ref 3.5–5.1)
Sodium: 138 mmol/L (ref 135–145)

## 2015-07-18 LAB — POCT I-STAT 3, ART BLOOD GAS (G3+)
Acid-base deficit: 5 mmol/L — ABNORMAL HIGH (ref 0.0–2.0)
Bicarbonate: 20.9 mEq/L (ref 20.0–24.0)
O2 Saturation: 99 %
PCO2 ART: 39.1 mmHg (ref 35.0–45.0)
PO2 ART: 147 mmHg — AB (ref 80.0–100.0)
TCO2: 22 mmol/L (ref 0–100)
pH, Arterial: 7.336 — ABNORMAL LOW (ref 7.350–7.450)

## 2015-07-18 LAB — BLOOD PRODUCT ORDER (VERBAL) VERIFICATION

## 2015-07-18 LAB — CBG MONITORING, ED: GLUCOSE-CAPILLARY: 112 mg/dL — AB (ref 65–99)

## 2015-07-18 LAB — GLUCOSE, CAPILLARY
GLUCOSE-CAPILLARY: 138 mg/dL — AB (ref 65–99)
GLUCOSE-CAPILLARY: 195 mg/dL — AB (ref 65–99)
Glucose-Capillary: 136 mg/dL — ABNORMAL HIGH (ref 65–99)
Glucose-Capillary: 153 mg/dL — ABNORMAL HIGH (ref 65–99)

## 2015-07-18 LAB — MRSA PCR SCREENING: MRSA BY PCR: NEGATIVE

## 2015-07-18 LAB — I-STAT TROPONIN, ED: Troponin i, poc: 0 ng/mL (ref 0.00–0.08)

## 2015-07-18 LAB — PROTIME-INR
INR: 1.15 (ref 0.00–1.49)
Prothrombin Time: 14.9 seconds (ref 11.6–15.2)

## 2015-07-18 LAB — ABO/RH: ABO/RH(D): B POS

## 2015-07-18 SURGERY — REPAIR, LACERATION, 2 OR MORE
Anesthesia: General | Site: Face

## 2015-07-18 MED ORDER — LIDOCAINE HCL (CARDIAC) 20 MG/ML IV SOLN
INTRAVENOUS | Status: DC | PRN
  Administered 2015-07-18: 60 mg via INTRATRACHEAL

## 2015-07-18 MED ORDER — MUPIROCIN 2 % EX OINT
TOPICAL_OINTMENT | Freq: Two times a day (BID) | CUTANEOUS | Status: DC
  Administered 2015-07-18 – 2015-07-20 (×5): via NASAL
  Administered 2015-07-20 – 2015-07-21 (×2): 1 via NASAL
  Administered 2015-07-21 – 2015-07-22 (×2): via NASAL
  Administered 2015-07-22: 1 via NASAL
  Administered 2015-07-23 – 2015-07-24 (×3): via NASAL
  Filled 2015-07-18: qty 22

## 2015-07-18 MED ORDER — FENTANYL CITRATE (PF) 100 MCG/2ML IJ SOLN
100.0000 ug | Freq: Once | INTRAMUSCULAR | Status: AC
  Administered 2015-07-18: 100 ug via INTRAVENOUS
  Filled 2015-07-18: qty 2

## 2015-07-18 MED ORDER — HYDROGEN PEROXIDE 3 % EX SOLN
CUTANEOUS | Status: DC | PRN
  Administered 2015-07-18: 1 via TOPICAL

## 2015-07-18 MED ORDER — MUPIROCIN 2 % EX OINT
TOPICAL_OINTMENT | CUTANEOUS | Status: DC | PRN
  Administered 2015-07-18: 1 via TOPICAL

## 2015-07-18 MED ORDER — TETANUS-DIPHTH-ACELL PERTUSSIS 5-2.5-18.5 LF-MCG/0.5 IM SUSP
0.5000 mL | Freq: Once | INTRAMUSCULAR | Status: AC
  Administered 2015-07-18: 0.5 mL via INTRAMUSCULAR
  Filled 2015-07-18: qty 0.5

## 2015-07-18 MED ORDER — ROCURONIUM BROMIDE 100 MG/10ML IV SOLN
INTRAVENOUS | Status: DC | PRN
  Administered 2015-07-18: 30 mg via INTRAVENOUS
  Administered 2015-07-18: 50 mg via INTRAVENOUS

## 2015-07-18 MED ORDER — ROCURONIUM BROMIDE 50 MG/5ML IV SOLN: INTRAVENOUS | Status: DC | PRN

## 2015-07-18 MED ORDER — LACTATED RINGERS IV SOLN
INTRAVENOUS | Status: DC
  Administered 2015-07-18: 08:00:00 via INTRAVENOUS

## 2015-07-18 MED ORDER — CHLORHEXIDINE GLUCONATE 0.12% ORAL RINSE (MEDLINE KIT)
15.0000 mL | Freq: Two times a day (BID) | OROMUCOSAL | Status: DC
  Administered 2015-07-18 – 2015-07-19 (×3): 15 mL via OROMUCOSAL

## 2015-07-18 MED ORDER — ETOMIDATE 2 MG/ML IV SOLN: INTRAVENOUS | Status: DC | PRN

## 2015-07-18 MED ORDER — FENTANYL CITRATE (PF) 250 MCG/5ML IJ SOLN
INTRAMUSCULAR | Status: AC
  Filled 2015-07-18: qty 5

## 2015-07-18 MED ORDER — PROPOFOL 10 MG/ML IV BOLUS
INTRAVENOUS | Status: AC
  Filled 2015-07-18: qty 20

## 2015-07-18 MED ORDER — OXYCODONE HCL 5 MG PO TABS: 5.0000 mg | ORAL_TABLET | ORAL | Status: DC | PRN

## 2015-07-18 MED ORDER — DOCUSATE SODIUM 50 MG/5ML PO LIQD
100.0000 mg | Freq: Two times a day (BID) | ORAL | Status: DC | PRN
  Filled 2015-07-18: qty 10

## 2015-07-18 MED ORDER — SODIUM CHLORIDE 0.9 % IV SOLN: INTRAVENOUS | Status: DC

## 2015-07-18 MED ORDER — SUCCINYLCHOLINE CHLORIDE 20 MG/ML IJ SOLN
INTRAMUSCULAR | Status: DC | PRN
  Administered 2015-07-18: 100 mg via INTRAVENOUS

## 2015-07-18 MED ORDER — SUCCINYLCHOLINE CHLORIDE 20 MG/ML IJ SOLN
INTRAMUSCULAR | Status: AC
  Filled 2015-07-18: qty 1

## 2015-07-18 MED ORDER — HYDROMORPHONE HCL 1 MG/ML IJ SOLN: 1.0000 mg | INTRAMUSCULAR | Status: DC | PRN

## 2015-07-18 MED ORDER — FENTANYL CITRATE (PF) 250 MCG/5ML IJ SOLN
INTRAMUSCULAR | Status: DC | PRN
  Administered 2015-07-18: 50 ug via INTRAVENOUS
  Administered 2015-07-18: 100 ug via INTRAVENOUS
  Administered 2015-07-18 (×2): 50 ug via INTRAVENOUS

## 2015-07-18 MED ORDER — FENTANYL BOLUS VIA INFUSION
50.0000 ug | INTRAVENOUS | Status: DC | PRN
  Administered 2015-07-18 – 2015-07-19 (×3): 50 ug via INTRAVENOUS
  Filled 2015-07-18: qty 50

## 2015-07-18 MED ORDER — ERYTHROMYCIN 5 MG/GM OP OINT
TOPICAL_OINTMENT | Freq: Three times a day (TID) | OPHTHALMIC | Status: DC
  Administered 2015-07-18 – 2015-07-20 (×8): via OPHTHALMIC
  Administered 2015-07-20: 1 via OPHTHALMIC
  Administered 2015-07-21 – 2015-07-22 (×4): via OPHTHALMIC
  Administered 2015-07-22 – 2015-07-23 (×3): 1 via OPHTHALMIC
  Administered 2015-07-23 (×2): via OPHTHALMIC
  Administered 2015-07-24: 1 via OPHTHALMIC
  Filled 2015-07-18: qty 3.5

## 2015-07-18 MED ORDER — ANTISEPTIC ORAL RINSE SOLUTION (CORINZ)
7.0000 mL | Freq: Four times a day (QID) | OROMUCOSAL | Status: DC
  Administered 2015-07-18 – 2015-07-19 (×10): 7 mL via OROMUCOSAL

## 2015-07-18 MED ORDER — ALPRAZOLAM 0.5 MG PO TABS
0.5000 mg | ORAL_TABLET | Freq: Two times a day (BID) | ORAL | Status: DC | PRN
Start: 2015-07-18 — End: 2015-07-18

## 2015-07-18 MED ORDER — DEXMEDETOMIDINE HCL IN NACL 400 MCG/100ML IV SOLN
0.0000 ug/kg/h | INTRAVENOUS | Status: DC
  Administered 2015-07-18 (×2): 0.5 ug/kg/h via INTRAVENOUS
  Administered 2015-07-19: 0.7 ug/kg/h via INTRAVENOUS
  Administered 2015-07-19: 0.4 ug/kg/h via INTRAVENOUS
  Administered 2015-07-20 – 2015-07-21 (×2): 0.3 ug/kg/h via INTRAVENOUS
  Filled 2015-07-18 (×6): qty 100
  Filled 2015-07-18: qty 50
  Filled 2015-07-18: qty 100
  Filled 2015-07-18: qty 50

## 2015-07-18 MED ORDER — LIDOCAINE HCL (CARDIAC) 20 MG/ML IV SOLN
INTRAVENOUS | Status: AC
  Filled 2015-07-18: qty 5

## 2015-07-18 MED ORDER — 0.9 % SODIUM CHLORIDE (POUR BTL) OPTIME
TOPICAL | Status: DC | PRN
  Administered 2015-07-18: 1000 mL

## 2015-07-18 MED ORDER — MIDAZOLAM HCL 2 MG/2ML IJ SOLN
INTRAMUSCULAR | Status: AC
  Filled 2015-07-18: qty 2

## 2015-07-18 MED ORDER — HYDROMORPHONE HCL 1 MG/ML IJ SOLN: 0.2500 mg | INTRAMUSCULAR | Status: DC | PRN

## 2015-07-18 MED ORDER — SODIUM CHLORIDE 0.9 % IV BOLUS (SEPSIS)
1000.0000 mL | Freq: Once | INTRAVENOUS | Status: AC
  Administered 2015-07-18: 1000 mL via INTRAVENOUS

## 2015-07-18 MED ORDER — SODIUM CHLORIDE 0.9 % IV SOLN
25.0000 ug/h | INTRAVENOUS | Status: DC
  Administered 2015-07-18: 200 ug/h via INTRAVENOUS
  Administered 2015-07-19: 150 ug/h via INTRAVENOUS
  Administered 2015-07-19: 125 ug/h via INTRAVENOUS
  Administered 2015-07-21: 75 ug/h via INTRAVENOUS
  Filled 2015-07-18 (×5): qty 50

## 2015-07-18 MED ORDER — ENOXAPARIN SODIUM 40 MG/0.4ML ~~LOC~~ SOLN
40.0000 mg | SUBCUTANEOUS | Status: DC
  Administered 2015-07-18 – 2015-07-24 (×7): 40 mg via SUBCUTANEOUS
  Filled 2015-07-18 (×8): qty 0.4

## 2015-07-18 MED ORDER — SODIUM CHLORIDE 0.9 % IR SOLN
Status: DC | PRN
  Administered 2015-07-18: 3000 mL

## 2015-07-18 MED ORDER — OXYCODONE HCL 5 MG PO TABS: 10.0000 mg | ORAL_TABLET | ORAL | Status: DC | PRN

## 2015-07-18 MED ORDER — MIDAZOLAM HCL 2 MG/2ML IJ SOLN
INTRAMUSCULAR | Status: DC | PRN
Start: 2015-07-18 — End: 2015-07-18
  Administered 2015-07-18: 2 mg via INTRAVENOUS

## 2015-07-18 MED ORDER — BISOPROLOL-HYDROCHLOROTHIAZIDE 5-6.25 MG PO TABS
1.0000 | ORAL_TABLET | Freq: Every day | ORAL | Status: DC
  Administered 2015-07-22 – 2015-07-24 (×3): 1 via ORAL
  Filled 2015-07-18 (×7): qty 1

## 2015-07-18 MED ORDER — PROPOFOL 10 MG/ML IV BOLUS
INTRAVENOUS | Status: DC | PRN
  Administered 2015-07-18: 130 mg via INTRAVENOUS

## 2015-07-18 MED ORDER — PROPOFOL 500 MG/50ML IV EMUL
INTRAVENOUS | Status: DC | PRN
  Administered 2015-07-18: 50 ug/kg/min via INTRAVENOUS

## 2015-07-18 MED ORDER — SERTRALINE HCL 50 MG PO TABS: 50.0000 mg | ORAL_TABLET | Freq: Every day | ORAL | Status: DC

## 2015-07-18 MED ORDER — LACTATED RINGERS IV SOLN
INTRAVENOUS | Status: DC | PRN
  Administered 2015-07-18 (×2): via INTRAVENOUS

## 2015-07-18 MED ORDER — FENTANYL CITRATE (PF) 100 MCG/2ML IJ SOLN
50.0000 ug | Freq: Once | INTRAMUSCULAR | Status: AC
Start: 2015-07-18 — End: 2015-07-18
  Administered 2015-07-18: 50 ug via INTRAVENOUS
  Filled 2015-07-18: qty 2

## 2015-07-18 MED ORDER — ONDANSETRON HCL 4 MG/2ML IJ SOLN
INTRAMUSCULAR | Status: DC | PRN
  Administered 2015-07-18: 4 mg via INTRAVENOUS

## 2015-07-18 MED ORDER — DEXAMETHASONE SODIUM PHOSPHATE 10 MG/ML IJ SOLN
INTRAMUSCULAR | Status: DC | PRN
  Administered 2015-07-18: 10 mg via INTRAVENOUS

## 2015-07-18 MED ORDER — CEFAZOLIN SODIUM-DEXTROSE 2-3 GM-% IV SOLR
INTRAVENOUS | Status: DC | PRN
  Administered 2015-07-18: 2 g via INTRAVENOUS

## 2015-07-18 MED ORDER — IOPAMIDOL (ISOVUE-300) INJECTION 61%
125.0000 mL | Freq: Once | INTRAVENOUS | Status: AC | PRN
  Administered 2015-07-18: 125 mL via INTRAVENOUS

## 2015-07-18 SURGICAL SUPPLY — 63 items
AIRSTRIP 4 3/4X3 1/4 7185 (GAUZE/BANDAGES/DRESSINGS) IMPLANT
BANDAGE ELASTIC 4 VELCRO ST LF (GAUZE/BANDAGES/DRESSINGS) ×4 IMPLANT
BLADE 10 SAFETY STRL DISP (BLADE) IMPLANT
BNDG COHESIVE 1X5 TAN STRL LF (GAUZE/BANDAGES/DRESSINGS) ×4 IMPLANT
BNDG CONFORM 2 STRL LF (GAUZE/BANDAGES/DRESSINGS) ×4 IMPLANT
BNDG GAUZE ELAST 4 BULKY (GAUZE/BANDAGES/DRESSINGS) ×4 IMPLANT
CANISTER SUCTION 2500CC (MISCELLANEOUS) IMPLANT
CATH ROBINSON RED A/P 16FR (CATHETERS) IMPLANT
CLEANER TIP ELECTROSURG 2X2 (MISCELLANEOUS) ×4 IMPLANT
CONT SPEC 4OZ CLIKSEAL STRL BL (MISCELLANEOUS) ×4 IMPLANT
CORDS BIPOLAR (ELECTRODE) ×4 IMPLANT
COVER SURGICAL LIGHT HANDLE (MISCELLANEOUS) ×4 IMPLANT
DRAIN PENROSE 1/4X12 LTX STRL (WOUND CARE) IMPLANT
DRAPE OEC MINIVIEW 54X84 (DRAPES) ×4 IMPLANT
DRSG EMULSION OIL 3X3 NADH (GAUZE/BANDAGES/DRESSINGS) IMPLANT
ELECT COATED BLADE 2.86 ST (ELECTRODE) ×8 IMPLANT
ELECT NEEDLE TIP 2.8 STRL (NEEDLE) IMPLANT
ELECT REM PT RETURN 9FT ADLT (ELECTROSURGICAL) ×4
ELECTRODE REM PT RTRN 9FT ADLT (ELECTROSURGICAL) ×2 IMPLANT
GAUZE SPONGE 4X4 12PLY STRL (GAUZE/BANDAGES/DRESSINGS) ×4 IMPLANT
GAUZE SPONGE 4X4 16PLY XRAY LF (GAUZE/BANDAGES/DRESSINGS) ×4 IMPLANT
GAUZE XEROFORM 5X9 LF (GAUZE/BANDAGES/DRESSINGS) ×4 IMPLANT
GLOVE BIO SURGEON STRL SZ 6.5 (GLOVE) ×3 IMPLANT
GLOVE BIO SURGEON STRL SZ7 (GLOVE) ×4 IMPLANT
GLOVE BIO SURGEONS STRL SZ 6.5 (GLOVE) ×1
GLOVE BIOGEL PI IND STRL 7.5 (GLOVE) ×2 IMPLANT
GLOVE BIOGEL PI INDICATOR 7.5 (GLOVE) ×2
GLOVE SURG SS PI 8.0 STRL IVOR (GLOVE) ×4 IMPLANT
GOWN STRL REUS W/ TWL LRG LVL3 (GOWN DISPOSABLE) ×4 IMPLANT
GOWN STRL REUS W/TWL LRG LVL3 (GOWN DISPOSABLE) ×4
KIT BASIN OR (CUSTOM PROCEDURE TRAY) ×4 IMPLANT
KIT ROOM TURNOVER OR (KITS) ×4 IMPLANT
NEEDLE 25GX 5/8IN NON SAFETY (NEEDLE) IMPLANT
NEEDLE HYPO 25GX1X1/2 BEV (NEEDLE) ×4 IMPLANT
NS IRRIG 1000ML POUR BTL (IV SOLUTION) ×4 IMPLANT
PAD ARMBOARD 7.5X6 YLW CONV (MISCELLANEOUS) ×8 IMPLANT
PENCIL BUTTON HOLSTER BLD 10FT (ELECTRODE) ×8 IMPLANT
SET CYSTO W/LG BORE CLAMP LF (SET/KITS/TRAYS/PACK) ×4 IMPLANT
STAPLER SKIN 35 WIDE (STAPLE) ×8 IMPLANT
SUT CHROMIC 4 0 P 3 18 (SUTURE) IMPLANT
SUT ETHILON 4 0 PS 2 18 (SUTURE) IMPLANT
SUT ETHILON 5 0 P 3 18 (SUTURE)
SUT ETHILON 5 0 PS 2 18 (SUTURE) ×4 IMPLANT
SUT MON AB 5-0 PS2 18 (SUTURE) ×32 IMPLANT
SUT NYLON ETHILON 5-0 P-3 1X18 (SUTURE) IMPLANT
SUT SILK 4 0 (SUTURE)
SUT SILK 4-0 18XBRD TIE 12 (SUTURE) IMPLANT
SUT VIC AB 3-0 SH 27 (SUTURE) ×4
SUT VIC AB 3-0 SH 27X BRD (SUTURE) ×4 IMPLANT
SUT VIC AB 4-0 PS2 27 (SUTURE) ×28 IMPLANT
SWAB COLLECTION DEVICE MRSA (MISCELLANEOUS) IMPLANT
SYR BULB IRRIGATION 50ML (SYRINGE) IMPLANT
SYR CONTROL 10ML LL (SYRINGE) ×4 IMPLANT
SYR TB 1ML LUER SLIP (SYRINGE) IMPLANT
TOWEL OR 17X24 6PK STRL BLUE (TOWEL DISPOSABLE) ×4 IMPLANT
TOWEL OR 17X26 10 PK STRL BLUE (TOWEL DISPOSABLE) ×4 IMPLANT
TRAY ENT MC OR (CUSTOM PROCEDURE TRAY) IMPLANT
TRAY FOLEY W/METER SILVER 16FR (SET/KITS/TRAYS/PACK) ×4 IMPLANT
TUBE ANAEROBIC SPECIMEN COL (MISCELLANEOUS) IMPLANT
TUBE CONNECTING 12'X1/4 (SUCTIONS) ×1
TUBE CONNECTING 12X1/4 (SUCTIONS) ×3 IMPLANT
WATER STERILE IRR 1000ML POUR (IV SOLUTION) ×4 IMPLANT
YANKAUER SUCT BULB TIP NO VENT (SUCTIONS) ×4 IMPLANT

## 2015-07-18 NOTE — Transfer of Care (Signed)
Immediate Anesthesia Transfer of Care Note  Patient: [name]  Procedure(s) Performed: Procedure(s): REPAIR MULTIPLE COMPLEX FACIAL LACERATIONS, 23, 24 Tooth extraction, COMPLEX LACERATION TONGUE REPAIR  (N/A) IRRIGATION AND DEBRIDEMENT LEFT ARM AND LACERATION CLOSURE (Left)  Patient Location: SICU  Anesthesia Type:General  Level of Consciousness: Patient remains intubated per anesthesia plan  Airway & Oxygen Therapy: Patient placed on Ventilator (see vital sign flow sheet for setting)  Post-op Assessment: Report given to RN and Post -op Vital signs reviewed and stable  Post vital signs: Reviewed and stable  Last Vitals:  Filed Vitals:   07/18/15 0200 07/18/15 0215  BP: 162/139 156/92  Resp: 20 22    Complications: No apparent anesthesia complications
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
## 2015-07-18 NOTE — Anesthesia Preprocedure Evaluation (Signed)
Anesthesia Evaluation  Patient identified by MRN, date of birth, ID band Patient awake  General Assessment Comment:In neck collar, moving all extremities  Reviewed: Allergy & Precautions, Patient's Chart, lab work & pertinent test results  Airway Mallampati: III   Neck ROM: Limited  Mouth opening: Limited Mouth Opening  Dental  (+) Loose, Dental Advisory Given, Teeth Intact Several loose lower teeth:   Pulmonary asthma ,     + decreased breath sounds      Cardiovascular hypertension,  Rhythm:Regular Rate:Tachycardia     Neuro/Psych    GI/Hepatic negative GI ROS, Neg liver ROS,   Endo/Other  negative endocrine ROS  Renal/GU negative Renal ROS     Musculoskeletal   Abdominal   Peds  Hematology  (+) Blood dyscrasia, anemia ,   Anesthesia Other Findings   Reproductive/Obstetrics                             Anesthesia Physical Anesthesia Plan  ASA: III and emergent  Anesthesia Plan: General   Post-op Pain Management:    Induction: Intravenous  Airway Management Planned: Oral ETT  Additional Equipment:   Intra-op Plan:   Post-operative Plan: Extubation in OR and Post-operative intubation/ventilation  Informed Consent: I have reviewed the patients History and Physical, chart, labs and discussed the procedure including the risks, benefits and alternatives for the proposed anesthesia with the patient or authorized representative who has indicated his/her understanding and acceptance.   Dental advisory given  Plan Discussed with: CRNA and Surgeon  Anesthesia Plan Comments:         Anesthesia Quick Evaluation
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
## 2015-07-18 NOTE — ED Notes (Signed)
Pt transported to CT by RN and EMT
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
## 2015-07-18 NOTE — ED Notes (Signed)
Oni at bedside, verbal order to intubate. Left to get Wyatt. Wyatt examined pt's mouth and breathing, then cancelled intubation.

## 2015-07-18 NOTE — ED Notes (Signed)
Pt transported to OR, short stay room 36 by RN and EMT
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
## 2015-07-18 NOTE — ED Notes (Signed)
Phlebotomy and radiology at bedside
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
## 2015-07-18 NOTE — ED Notes (Signed)
Pt transported back to room. [facility] arrived to take photos of pt's wounds.
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
## 2015-07-18 NOTE — Anesthesia Postprocedure Evaluation (Signed)
Anesthesia Post Note  Patient: MAUDEEN KIMPEL  Procedure(s) Performed: Procedure(s) (LRB): REPAIR MULTIPLE COMPLEX FACIAL LACERATIONS, 23, 24 Tooth extraction, COMPLEX LACERATION TONGUE REPAIR  (N/A) IRRIGATION AND DEBRIDEMENT LEFT ARM AND LACERATION CLOSURE (Left)  Patient location during evaluation: SICU Anesthesia Type: General Level of consciousness: sedated and patient remains intubated per anesthesia plan Pain management: pain level controlled Vital Signs Assessment: post-procedure vital signs reviewed and stable Respiratory status: patient remains intubated per anesthesia plan Cardiovascular status: stable Anesthetic complications: no    Last Vitals:  Filed Vitals:   07/18/15 0200 07/18/15 0215  BP: 162/139 156/92  Resp: 20 22    Last Pain: There were no vitals filed for this visit.               Johnie Makki,JAMES TERRILL
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
## 2015-07-18 NOTE — Progress Notes (Signed)
Orthopedic Tech Progress Note Patient Details:  [name] [birth year] [id number]  Ortho Devices Type of Ortho Device: Shoulder immobilizer Ortho Device/Splint Interventions: Application   [name] 07/18/2015, 11:33 AM
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
## 2015-07-18 NOTE — Discharge Instructions (Signed)
Dressings LUE: leave dressings on fingers in place and do not get wet for at least 72 hours. Then may remove, wash with soap and water, then apply a small amount of antibiotic ointment (bacitracin) and cover with band aids.  Dressing forearm, leave in place and do not get wet until seen by MD in ~10 days. - Dr. Lenon Curt

## 2015-07-18 NOTE — ED Notes (Signed)
Dr. Royce Macadamia (plastic surgery) at bedside examining wounds.

## 2015-07-18 NOTE — Anesthesia Procedure Notes (Signed)
Procedure Name: Intubation Date/Time: 07/18/2015 3:49 AM Performed by: Valetta Fuller Pre-anesthesia Checklist: Patient identified, Emergency Drugs available, Suction available and Patient being monitored Patient Re-evaluated:Patient Re-evaluated prior to inductionOxygen Delivery Method: Circle system utilized Preoxygenation: Pre-oxygenation with 100% oxygen Intubation Type: IV induction, Rapid sequence and Cricoid Pressure applied Laryngoscope Size: Glidescope Grade View: Grade I Tube type: Oral Tube size: 7.0 mm Number of attempts: 1 Airway Equipment and Method: Stylet Placement Confirmation: ETT inserted through vocal cords under direct vision,  positive ETCO2 and breath sounds checked- equal and bilateral Secured at: 24 cm Tube secured with: Tape Dental Injury: Teeth and Oropharynx as per pre-operative assessment  Comments: Cervical collar removed for induction; inline traction and cricoid pressure applied for induction/intubation; cervical collar replaced after intubation.

## 2015-07-18 NOTE — ED Notes (Signed)
Hulen Skains, MD at bedside examining pt. Verbal order for xray chest/pelvic/left arm and to cover left arm with moist gauze, wet dressing. Lita Mains, RN, applying dressing as ordered.

## 2015-07-18 NOTE — Progress Notes (Signed)
PT Cancellation Note  Patient Details Name: [name] MRN: [id number] DOB: March 26, 1976   Cancelled Treatment:    Reason Eval/Treat Not Completed: Patient not medically ready.  Pt currently on bedrest and note that Neurosurgery has been consulted, but there isn't a note in the chart at this time.  Will hold PT and mobility until consults completed and activity order is advanced.     Catarina Hartshorn, [place] 07/18/2015, 7:51 AM

## 2015-07-19 ENCOUNTER — Inpatient Hospital Stay (HOSPITAL_COMMUNITY): Payer: BLUE CROSS/BLUE SHIELD

## 2015-07-19 DIAGNOSIS — S12000A Unspecified displaced fracture of first cervical vertebra, initial encounter for closed fracture: Secondary | ICD-10-CM

## 2015-07-19 DIAGNOSIS — J988 Other specified respiratory disorders: Secondary | ICD-10-CM

## 2015-07-19 DIAGNOSIS — D62 Acute posthemorrhagic anemia: Secondary | ICD-10-CM | POA: Diagnosis not present

## 2015-07-19 DIAGNOSIS — Z9911 Dependence on respirator [ventilator] status: Secondary | ICD-10-CM

## 2015-07-19 HISTORY — DX: Unspecified displaced fracture of first cervical vertebra, initial encounter for closed fracture: S12.000A

## 2015-07-19 LAB — CBC
HCT: 26.8 % — ABNORMAL LOW (ref 36.0–46.0)
HEMOGLOBIN: 8.4 g/dL — AB (ref 12.0–15.0)
MCH: 25.5 pg — AB (ref 26.0–34.0)
MCHC: 31.3 g/dL (ref 30.0–36.0)
MCV: 81.5 fL (ref 78.0–100.0)
Platelets: 278 10*3/uL (ref 150–400)
RBC: 3.29 MIL/uL — AB (ref 3.87–5.11)
RDW: 15.2 % (ref 11.5–15.5)
WBC: 16.5 10*3/uL — AB (ref 4.0–10.5)

## 2015-07-19 LAB — GLUCOSE, CAPILLARY
GLUCOSE-CAPILLARY: 129 mg/dL — AB (ref 65–99)
Glucose-Capillary: 134 mg/dL — ABNORMAL HIGH (ref 65–99)
Glucose-Capillary: 68 mg/dL (ref 65–99)
Glucose-Capillary: 94 mg/dL (ref 65–99)
Glucose-Capillary: 85 mg/dL (ref 65–99)

## 2015-07-19 LAB — BASIC METABOLIC PANEL
ANION GAP: 10 (ref 5–15)
BUN: 15 mg/dL (ref 6–20)
CHLORIDE: 106 mmol/L (ref 101–111)
CO2: 23 mmol/L (ref 22–32)
CREATININE: 1.03 mg/dL — AB (ref 0.44–1.00)
Calcium: 8.5 mg/dL — ABNORMAL LOW (ref 8.9–10.3)
GFR calc non Af Amer: >60 mL/min (ref >60)
Glucose, Bld: 141 mg/dL — ABNORMAL HIGH (ref 65–99)
Potassium: 4.4 mmol/L (ref 3.5–5.1)
SODIUM: 139 mmol/L (ref 135–145)

## 2015-07-19 MED ORDER — PIVOT 1.5 CAL PO LIQD: 1000.0000 mL | ORAL | Status: DC

## 2015-07-19 MED ORDER — PRO-STAT SUGAR FREE PO LIQD
60.0000 mL | Freq: Every day | ORAL | Status: DC
  Administered 2015-07-19 – 2015-07-22 (×4): 60 mL
  Filled 2015-07-19 (×4): qty 60

## 2015-07-19 MED ORDER — SODIUM CHLORIDE 0.45 % IV SOLN
INTRAVENOUS | Status: DC
  Administered 2015-07-19 – 2015-07-22 (×10): via INTRAVENOUS
  Filled 2015-07-19 (×17): qty 1000

## 2015-07-19 MED ORDER — PIVOT 1.5 CAL PO LIQD
1000.0000 mL | ORAL | Status: DC
  Administered 2015-07-19 – 2015-07-20 (×2): 1000 mL

## 2015-07-19 MED ORDER — ANTISEPTIC ORAL RINSE SOLUTION (CORINZ)
7.0000 mL | OROMUCOSAL | Status: DC
  Administered 2015-07-19 – 2015-07-24 (×40): 7 mL via OROMUCOSAL

## 2015-07-19 MED ORDER — ANTISEPTIC ORAL RINSE SOLUTION (CORINZ): 7.0000 mL | OROMUCOSAL | Status: DC

## 2015-07-19 MED ORDER — CHLORHEXIDINE GLUCONATE 0.12% ORAL RINSE (MEDLINE KIT)
15.0000 mL | Freq: Two times a day (BID) | OROMUCOSAL | Status: DC
  Administered 2015-07-19 – 2015-07-24 (×11): 15 mL via OROMUCOSAL

## 2015-07-19 MED ORDER — CHLORHEXIDINE GLUCONATE 0.12% ORAL RINSE (MEDLINE KIT): 15.0000 mL | Freq: Two times a day (BID) | OROMUCOSAL | Status: DC

## 2015-07-19 MED ORDER — PANTOPRAZOLE SODIUM 40 MG IV SOLR
40.0000 mg | INTRAVENOUS | Status: DC
  Administered 2015-07-19 – 2015-07-23 (×5): 40 mg via INTRAVENOUS
  Filled 2015-07-19 (×5): qty 40
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
## 2015-07-19 NOTE — Progress Notes (Signed)
PT Cancellation Note  Patient Details Name: DAWNNA BORROEL MRN: [id number] DOB: [birth year]   Cancelled Treatment:    Reason Eval/Treat Not Completed: Patient not medically ready.  Patient on vent.  Will return at later time for PT evaluation.   Despina Pole 07/19/2015, 9:52 AM Carita Pian. Sanjuana Kava, [place] Pager [phone]
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
## 2015-07-20 ENCOUNTER — Other Ambulatory Visit: Payer: Self-pay | Admitting: Internal Medicine

## 2015-07-20 LAB — CBC
HCT: 27.5 % — ABNORMAL LOW (ref 36.0–46.0)
Hemoglobin: 8.3 g/dL — ABNORMAL LOW (ref 12.0–15.0)
MCH: 24.3 pg — ABNORMAL LOW (ref 26.0–34.0)
MCHC: 30.2 g/dL (ref 30.0–36.0)
MCV: 80.6 fL (ref 78.0–100.0)
PLATELETS: 236 10*3/uL (ref 150–400)
RBC: 3.41 MIL/uL — AB (ref 3.87–5.11)
RDW: 15.1 % (ref 11.5–15.5)
WBC: 11.6 10*3/uL — AB (ref 4.0–10.5)

## 2015-07-20 LAB — GLUCOSE, CAPILLARY
GLUCOSE-CAPILLARY: 84 mg/dL (ref 65–99)
GLUCOSE-CAPILLARY: 85 mg/dL (ref 65–99)
GLUCOSE-CAPILLARY: 80 mg/dL (ref 65–99)
GLUCOSE-CAPILLARY: 72 mg/dL (ref 65–99)
GLUCOSE-CAPILLARY: 83 mg/dL (ref 65–99)

## 2015-07-20 NOTE — Progress Notes (Signed)
PT Cancellation Note  Patient Details Name: [name] MRN: [id number] DOB: December 30, 1975   Cancelled Treatment:    Reason Eval/Treat Not Completed: Medical issues which prohibited therapy (Pt remains on vent.)   Charlean Carneal 07/20/2015, 11:27 AM Suanne Marker PT [phone]
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
## 2015-07-21 LAB — GLUCOSE, CAPILLARY
GLUCOSE-CAPILLARY: 87 mg/dL (ref 65–99)
GLUCOSE-CAPILLARY: 89 mg/dL (ref 65–99)
GLUCOSE-CAPILLARY: 95 mg/dL (ref 65–99)
Glucose-Capillary: 86 mg/dL (ref 65–99)
Glucose-Capillary: 87 mg/dL (ref 65–99)
Glucose-Capillary: 111 mg/dL — ABNORMAL HIGH (ref 65–99)

## 2015-07-21 MED ORDER — DEXMEDETOMIDINE HCL IN NACL 400 MCG/100ML IV SOLN
0.0000 ug/kg/h | INTRAVENOUS | Status: DC
  Administered 2015-07-22: 0.2 ug/kg/h via INTRAVENOUS
  Administered 2015-07-22: 0.4 ug/kg/h via INTRAVENOUS
  Filled 2015-07-21 (×2): qty 100

## 2015-07-21 MED ORDER — DEXMEDETOMIDINE HCL IN NACL 200 MCG/50ML IV SOLN
0.0000 ug/kg/h | INTRAVENOUS | Status: DC
  Administered 2015-07-21: 0.4 ug/kg/h via INTRAVENOUS
  Filled 2015-07-21: qty 50

## 2015-07-21 MED ORDER — POLYETHYLENE GLYCOL 3350 17 G PO PACK
17.0000 g | PACK | Freq: Every day | ORAL | Status: DC
  Administered 2015-07-21 – 2015-07-24 (×4): 17 g
  Filled 2015-07-21 (×4): qty 1
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
## 2015-07-21 NOTE — Progress Notes (Signed)
PT Cancellation Note  Patient Details Name: [name] MRN: [id number] DOB: [birth year]   Cancelled Treatment:    Reason Eval/Treat Not Completed: Medical issues which prohibited therapy (Patient remains on vent)   Despina Pole 07/21/2015, 9:46 AM Carita Pian. Sanjuana Kava, [place] Pager [phone]

## 2015-07-22 ENCOUNTER — Inpatient Hospital Stay (HOSPITAL_COMMUNITY): Payer: BLUE CROSS/BLUE SHIELD

## 2015-07-22 ENCOUNTER — Encounter: Payer: Self-pay | Admitting: Orthopedic Surgery

## 2015-07-22 LAB — GLUCOSE, CAPILLARY
GLUCOSE-CAPILLARY: 102 mg/dL — AB (ref 65–99)
GLUCOSE-CAPILLARY: 106 mg/dL — AB (ref 65–99)
GLUCOSE-CAPILLARY: 79 mg/dL (ref 65–99)
Glucose-Capillary: 114 mg/dL — ABNORMAL HIGH (ref 65–99)
Glucose-Capillary: 117 mg/dL — ABNORMAL HIGH (ref 65–99)
Glucose-Capillary: 138 mg/dL — ABNORMAL HIGH (ref 65–99)
Glucose-Capillary: 142 mg/dL — ABNORMAL HIGH (ref 65–99)

## 2015-07-22 LAB — CBC WITH DIFFERENTIAL/PLATELET
BASOS PCT: 0 %
Basophils Absolute: 0 10*3/uL (ref 0.0–0.1)
EOS ABS: 0.1 10*3/uL (ref 0.0–0.7)
Eosinophils Relative: 1 %
HCT: 24.4 % — ABNORMAL LOW (ref 36.0–46.0)
HEMOGLOBIN: 7.4 g/dL — AB (ref 12.0–15.0)
Lymphocytes Relative: 17 %
Lymphs Abs: 1.7 10*3/uL (ref 0.7–4.0)
MCH: 24.6 pg — ABNORMAL LOW (ref 26.0–34.0)
MCHC: 30.3 g/dL (ref 30.0–36.0)
MCV: 81.1 fL (ref 78.0–100.0)
MONOS PCT: 11 %
Monocytes Absolute: 1.1 10*3/uL — ABNORMAL HIGH (ref 0.1–1.0)
NEUTROS PCT: 71 %
Neutro Abs: 7.3 10*3/uL (ref 1.7–7.7)
Platelets: 357 10*3/uL (ref 150–400)
RBC: 3.01 MIL/uL — AB (ref 3.87–5.11)
RDW: 14.7 % (ref 11.5–15.5)
WBC: 10.3 10*3/uL (ref 4.0–10.5)

## 2015-07-22 LAB — BASIC METABOLIC PANEL
Anion gap: 9 (ref 5–15)
BUN: 9 mg/dL (ref 6–20)
CHLORIDE: 103 mmol/L (ref 101–111)
CO2: 25 mmol/L (ref 22–32)
Calcium: 8.6 mg/dL — ABNORMAL LOW (ref 8.9–10.3)
Creatinine, Ser: 0.76 mg/dL (ref 0.44–1.00)
GFR calc non Af Amer: >60 mL/min (ref >60)
Glucose, Bld: 123 mg/dL — ABNORMAL HIGH (ref 65–99)
POTASSIUM: 4 mmol/L (ref 3.5–5.1)
SODIUM: 137 mmol/L (ref 135–145)

## 2015-07-22 MED ORDER — CHLORHEXIDINE GLUCONATE 0.12 % MT SOLN
OROMUCOSAL | Status: AC
  Filled 2015-07-22: qty 15

## 2015-07-22 MED ORDER — DEXAMETHASONE SODIUM PHOSPHATE 4 MG/ML IJ SOLN
4.0000 mg | Freq: Two times a day (BID) | INTRAMUSCULAR | Status: AC
  Administered 2015-07-23 – 2015-07-24 (×2): 4 mg via INTRAVENOUS
  Filled 2015-07-22 (×2): qty 1

## 2015-07-22 MED ORDER — ALBUTEROL SULFATE (2.5 MG/3ML) 0.083% IN NEBU
2.5000 mg | INHALATION_SOLUTION | RESPIRATORY_TRACT | Status: DC
  Administered 2015-07-22 – 2015-07-23 (×6): 2.5 mg via RESPIRATORY_TRACT
  Filled 2015-07-22 (×5): qty 3

## 2015-07-22 MED ORDER — DEXAMETHASONE SODIUM PHOSPHATE 4 MG/ML IJ SOLN
4.0000 mg | Freq: Four times a day (QID) | INTRAMUSCULAR | Status: DC
  Filled 2015-07-22: qty 1

## 2015-07-22 MED ORDER — RACEPINEPHRINE HCL 2.25 % IN NEBU
0.5000 mL | INHALATION_SOLUTION | RESPIRATORY_TRACT | Status: DC | PRN
  Administered 2015-07-22 – 2015-07-23 (×2): 0.5 mL via RESPIRATORY_TRACT
  Filled 2015-07-22 (×3): qty 0.5

## 2015-07-22 MED ORDER — DEXAMETHASONE SODIUM PHOSPHATE 4 MG/ML IJ SOLN
4.0000 mg | Freq: Four times a day (QID) | INTRAMUSCULAR | Status: AC
  Administered 2015-07-22 – 2015-07-23 (×3): 4 mg via INTRAVENOUS
  Filled 2015-07-22 (×4): qty 1

## 2015-07-22 MED ORDER — FENTANYL CITRATE (PF) 100 MCG/2ML IJ SOLN
50.0000 ug | INTRAMUSCULAR | Status: DC | PRN
  Administered 2015-07-22 – 2015-07-23 (×2): 100 ug via INTRAVENOUS
  Filled 2015-07-22 (×2): qty 2

## 2015-07-22 MED ORDER — DEXAMETHASONE SODIUM PHOSPHATE 4 MG/ML IJ SOLN
4.0000 mg | Freq: Once | INTRAMUSCULAR | Status: AC
  Administered 2015-07-22: 4 mg via INTRAVENOUS
  Filled 2015-07-22: qty 1

## 2015-07-22 MED ORDER — FENTANYL BOLUS VIA INFUSION
50.0000 ug | INTRAVENOUS | Status: DC | PRN
  Filled 2015-07-22: qty 100

## 2015-07-22 MED ORDER — ALBUTEROL SULFATE HFA 108 (90 BASE) MCG/ACT IN AERS: 2.0000 | INHALATION_SPRAY | RESPIRATORY_TRACT | Status: DC

## 2015-07-22 MED ORDER — ALBUTEROL SULFATE (2.5 MG/3ML) 0.083% IN NEBU
INHALATION_SOLUTION | RESPIRATORY_TRACT | Status: AC
  Filled 2015-07-22: qty 3

## 2015-07-22 MED ORDER — DEXAMETHASONE SODIUM PHOSPHATE 4 MG/ML IJ SOLN: 4.0000 mg | Freq: Two times a day (BID) | INTRAMUSCULAR | Status: DC

## 2015-07-22 NOTE — Progress Notes (Signed)
Follow up - Trauma and Critical Care  Patient Details:    [name] is an 40 y.o. female.  Lines/tubes : Airway 7 mm (Active)  Secured at (cm) 23 cm 07/22/2015  4:02 AM  Measured From Lips 07/22/2015  4:02 AM  [place] 07/22/2015  4:02 AM  Secured By [employer] 07/22/2015  4:02 AM  Tube Holder Repositioned Yes 07/22/2015  4:02 AM  Cuff Pressure (cm H2O) 24 cm H2O 07/22/2015  4:02 AM  Site Condition Dry;Edema 07/22/2015  4:02 AM     NG/OG Tube Orogastric 16 Fr. Center mouth (Active)  Placement Verification Auscultation 07/21/2015  8:00 PM  Site Assessment Clean;Dry;Intact 07/21/2015  8:00 PM  Status Infusing tube feed 07/21/2015  8:00 PM  Amount of suction 100 mmHg 07/19/2015 11:00 AM  Drainage Appearance Brown 07/19/2015 11:00 AM  Intake (mL) 30 mL 07/21/2015  8:00 PM  Output (mL) 0 mL 07/19/2015  2:00 PM     Urethral Catheter T [employer] Latex;Straight-tip 16 Fr. (Active)  Indication for Insertion or Continuance of Catheter Aggressive IV diuresis;Peri-operative use for selective surgical procedure 07/21/2015  8:00 PM  Site Assessment Clean;Intact;Dry 07/21/2015  8:00 PM  Catheter Maintenance Bag below level of bladder;Catheter secured;Drainage bag/tubing not touching floor;Insertion date on drainage bag;No dependent loops;Seal intact 07/21/2015  8:00 PM  Collection Container Standard drainage bag 07/21/2015  8:00 PM  Securement Method Leg strap 07/21/2015  8:00 PM  Urinary Catheter Interventions Unclamped 07/21/2015  8:00 PM  Input (mL) 30 mL 07/19/2015  2:00 PM  Output (mL) 275 mL 07/22/2015  6:00 AM    Microbiology/Sepsis markers: Results for orders placed or performed during the hospital encounter of 07/18/15  MRSA PCR Screening     Status: None   Collection Time: 07/18/15  1:00 PM  Result Value Ref Range Status   MRSA by PCR NEGATIVE NEGATIVE Final    Comment:        The GeneXpert MRSA Assay (FDA approved for NASAL specimens only), is one component of  a comprehensive MRSA colonization surveillance program. It is not intended to diagnose MRSA infection nor to guide or monitor treatment for MRSA infections.     Anti-infectives:  Anti-infectives    None      Best Practice/Protocols:  VTE Prophylaxis: Lovenox (prophylaxtic dose) and Mechanical GI Prophylaxis: Proton Pump Inhibitor Continous Sedation Precedex and fentanyl  Consults: Treatment Team:  Kary Kos, MD    Events:  Subjective:    Overnight Issues: Patient weaned most of yesterday and did very well.  Concerns are about the lingual swelling.  Objective:  Vital signs for last 24 hours: Temp:  [98.7 F (37.1 C)-100.5 F (38.1 C)] 99.5 F (37.5 C) (04/17 0726) Pulse Rate:  [75-106] 86 (04/17 0700) Resp:  [15-28] 28 (04/17 0700) BP: (87-141)/(54-95) 136/95 mmHg (04/17 0700) SpO2:  [97 %-100 %] 100 % (04/17 0700) FiO2 (%):  [40 %] 40 % (04/17 0402) Weight:  [82.8 kg (182 lb 8.7 oz)] 82.8 kg (182 lb 8.7 oz) (04/17 0500)  Hemodynamic parameters for last 24 hours:    Intake/Output from previous day: 04/16 0701 - 04/17 0700 In: 3697.8 [I.V.:3357.8; NG/GT:340] Out: 4225 [Urine:4225]  Intake/Output this shift:    Vent settings for last 24 hours: Vent Mode:  [-] PRVC FiO2 (%):  [40 %] 40 % Set Rate:  [16 bmp] 16 bmp Vt Set:  [400 mL] 400 mL PEEP:  [5 cmH20] 5 cmH20 Pressure Support:  [10 cmH20] 10 cmH20 Plateau Pressure:  [  20 cmH20-25 cmH20] 25 cmH20  Physical Exam:  General: alert and no respiratory distress Neuro: alert, oriented, nonfocal exam and RASS 0 HEENT/Neck: ETT WNL  and Lingual swelling improved from last week Resp: clear to auscultation bilaterally CVS: regular rate and rhythm, S1, S2 normal, no murmur, click, rub or gallop GI: soft, nontender, BS WNL, no r/g and tolerating tube feedings. Extremities: no edema, no erythema, pulses WNL  Results for orders placed or performed during the hospital encounter of 07/18/15 (from the past 24  hour(s))  Glucose, capillary     Status: None   Collection Time: 07/21/15  7:53 AM  Result Value Ref Range   Glucose-Capillary 86 65 - 99 mg/dL   Comment 1 Notify RN   Glucose, capillary     Status: None   Collection Time: 07/21/15 11:28 AM  Result Value Ref Range   Glucose-Capillary 87 65 - 99 mg/dL   Comment 1 Notify RN   Glucose, capillary     Status: None   Collection Time: 07/21/15  4:37 PM  Result Value Ref Range   Glucose-Capillary 89 65 - 99 mg/dL   Comment 1 Notify RN   Glucose, capillary     Status: Abnormal   Collection Time: 07/21/15  7:30 PM  Result Value Ref Range   Glucose-Capillary 111 (H) 65 - 99 mg/dL  Glucose, capillary     Status: Abnormal   Collection Time: 07/22/15 12:38 AM  Result Value Ref Range   Glucose-Capillary 138 (H) 65 - 99 mg/dL   Comment 1 Notify RN    Comment 2 Document in Chart   Glucose, capillary     Status: Abnormal   Collection Time: 07/22/15  4:25 AM  Result Value Ref Range   Glucose-Capillary 106 (H) 65 - 99 mg/dL   Comment 1 Notify RN    Comment 2 Document in Chart      Assessment/Plan:   NEURO  Altered Mental Status:  sedation and but able to communicate.   Plan: Hold fentany for potential extubation  PULM  CXR clear.   Plan: No specific management.  Airway is the primary concern for extubation.  CARDIO  No issues   Plan: CPM  RENAL  No issues   Plan: CPM  GI  No issues   Plan: CPM.  Tolerating tube feedings which will be held prior to extubation  ID  No known infectious sources   Plan: CPM  HEME  No recent labs.  Will recheck.   Plan: CPM  ENDO No known issues.   Plan: CPM  Global Issues  Patient doing well and the swelling in her toungue seems to have improved to the point where extubation can be attempted.  Will try to do so with re-intubation equipment at bedside and trach set at the bedside.    LOS: 4 days   Additional comments:I reviewed the patients new imaging test results. cxr  Critical Care Total  Time*: 30 Minutes  [name] 07/22/2015  *Care during the described time interval was provided by me and/or other providers on the critical care team.  I have reviewed this patient's available data, including medical history, events of note, physical examination and test results as part of my evaluation.

## 2015-07-22 NOTE — Procedures (Signed)
Extubation Procedure Note  Patient Details:   Name: [name] DOB: 1975/05/15 MRN: [id number]   Airway Documentation:     Evaluation  O2 sats: stable throughout Complications: No apparent complications Patient did tolerate procedure well. Bilateral Breath Sounds: Diminished   Yes  Pt extubated to 4LPM [place].  RT will continue to monitor.  Cordella Register 07/22/2015, 10:24 AM
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
## 2015-07-22 NOTE — Progress Notes (Signed)
Patient extubated and doing fine.  No stridor.  No lingual obstruction.  Will keep tracheostomy equipment at the bedside until this afternoon.  Swallowing evaluation later today or tomorrow. AM.  Will change pain medications.  Kathryne Eriksson. Dahlia Bailiff, MD, [place] [phone] Trauma Surgeon

## 2015-07-22 NOTE — Progress Notes (Signed)
OT Cancellation Note  Patient Details Name: [name] MRN: [id number] DOB: 1975/06/17   Cancelled Treatment:    Reason Eval/Treat Not Completed: Patient not medically ready (Pt weaning from vent. Will continue to follow.)  [name] 07/22/2015, 8:31 AM

## 2015-07-23 ENCOUNTER — Encounter (HOSPITAL_COMMUNITY): Payer: Self-pay | Admitting: Plastic Surgery

## 2015-07-23 DIAGNOSIS — R52 Pain, unspecified: Secondary | ICD-10-CM

## 2015-07-23 DIAGNOSIS — F329 Major depressive disorder, single episode, unspecified: Secondary | ICD-10-CM

## 2015-07-23 DIAGNOSIS — S0181XA Laceration without foreign body of other part of head, initial encounter: Secondary | ICD-10-CM

## 2015-07-23 DIAGNOSIS — D62 Acute posthemorrhagic anemia: Secondary | ICD-10-CM

## 2015-07-23 DIAGNOSIS — F32A Depression, unspecified: Secondary | ICD-10-CM | POA: Insufficient documentation

## 2015-07-23 DIAGNOSIS — S12001D Unspecified nondisplaced fracture of first cervical vertebra, subsequent encounter for fracture with routine healing: Secondary | ICD-10-CM

## 2015-07-23 DIAGNOSIS — R1310 Dysphagia, unspecified: Secondary | ICD-10-CM

## 2015-07-23 DIAGNOSIS — J45909 Unspecified asthma, uncomplicated: Secondary | ICD-10-CM

## 2015-07-23 DIAGNOSIS — S41112D Laceration without foreign body of left upper arm, subsequent encounter: Secondary | ICD-10-CM

## 2015-07-23 DIAGNOSIS — I10 Essential (primary) hypertension: Secondary | ICD-10-CM

## 2015-07-23 LAB — GLUCOSE, CAPILLARY
GLUCOSE-CAPILLARY: 137 mg/dL — AB (ref 65–99)
Glucose-Capillary: 141 mg/dL — ABNORMAL HIGH (ref 65–99)
Glucose-Capillary: 143 mg/dL — ABNORMAL HIGH (ref 65–99)
Glucose-Capillary: 146 mg/dL — ABNORMAL HIGH (ref 65–99)

## 2015-07-23 MED ORDER — STARCH (THICKENING) PO POWD
ORAL | Status: DC | PRN
  Filled 2015-07-23: qty 227

## 2015-07-23 MED ORDER — ALBUTEROL SULFATE (2.5 MG/3ML) 0.083% IN NEBU: 2.5000 mg | INHALATION_SOLUTION | Freq: Four times a day (QID) | RESPIRATORY_TRACT | Status: DC | PRN

## 2015-07-23 MED ORDER — HYDROMORPHONE HCL 1 MG/ML IJ SOLN
0.5000 mg | INTRAMUSCULAR | Status: DC | PRN
  Administered 2015-07-23 (×2): 1 mg via INTRAVENOUS
  Administered 2015-07-23: 0.5 mg via INTRAVENOUS
  Administered 2015-07-24: 1 mg via INTRAVENOUS
  Filled 2015-07-23 (×4): qty 1

## 2015-07-23 MED ORDER — WHITE PETROLATUM GEL
Status: AC
  Administered 2015-07-23: 15:00:00
  Filled 2015-07-23: qty 1
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
## 2015-07-23 NOTE — Progress Notes (Signed)
Trauma Service Note  Subjective: Patient doing well this AM.  No respiratory distress.  Voice is stronger.  No stridor.  On steroids for 24 more hours.  Objective: Vital signs in last 24 hours: Temp:  [98.3 F (36.8 C)-100.4 F (38 C)] 98.4 F (36.9 C) (04/18 0430) Pulse Rate:  [77-107] 80 (04/18 0700) Resp:  [12-23] 19 (04/18 0700) BP: (108-158)/(67-100) 124/77 mmHg (04/18 0700) SpO2:  [97 %-100 %] 99 % (04/18 0700) FiO2 (%):  [40 %] 40 % (04/17 0753) Weight:  [82.7 kg (182 lb 5.1 oz)] 82.7 kg (182 lb 5.1 oz) (04/18 0500)    Intake/Output from previous day: 04/17 0701 - 04/18 0700 In: 3247.7 [I.V.:3227.7; NG/GT:20] Out: [id number] [Urine:4175] Intake/Output this shift:    General: No acute distress.  Says that her neck hurts.  Lungs: Clear.  Oxygen saturations 100%.  Will wean off oxygen for sats 95% or above   Abd: Benign  Extremities: No changes  Neuro: Intact  Lab Results: CBC   Recent Labs  07/20/15 1318 07/22/15 0830  WBC 11.6* 10.3  HGB 8.3* 7.4*  HCT 27.5* 24.4*  PLT 236 357   BMET  Recent Labs  07/22/15 0830  NA 137  K 4.0  CL 103  CO2 25  GLUCOSE 123*  BUN 9  CREATININE 0.76  CALCIUM 8.6*   PT/INR No results for input(s): LABPROT, INR in the last 72 hours. ABG No results for input(s): PHART, HCO3 in the last 72 hours.  Invalid input(s): PCO2, PO2  Studies/Results: Dg Chest Port 1 View  07/22/2015  CLINICAL DATA:  40 year old female with acute respiratory failure. Subsequent encounter. EXAM: PORTABLE CHEST 1 VIEW COMPARISON:  07/19/2015 chest x-ray. FINDINGS: Endotracheal tube tip 2 cm above the carina. Nasogastric tube enters the stomach with the side hole at the gastric body level and tip not imaged. Chin overlies the lung apices.  No gross pneumothorax. Cardiomegaly. Central pulmonary vascular prominence. No segmental consolidation. IMPRESSION: Slightly limited exam without evidence of gross pneumothorax, pulmonary edema or segmental  consolidation. Electronically Signed   By: Genia Del M.D.   On: 07/22/2015 07:53    Anti-infectives: Anti-infectives    None      Assessment/Plan: s/p Procedure(s): REPAIR MULTIPLE COMPLEX FACIAL LACERATIONS, 23, 24 Tooth extraction, COMPLEX LACERATION TONGUE REPAIR  IRRIGATION AND DEBRIDEMENT LEFT ARM AND LACERATION CLOSURE Swallowing evaluation.  Advance diet.  Transfer to the floor.  LOS: 5 days   Kathryne Eriksson. Dahlia Bailiff, MD, FACS [phone] Trauma Surgeon 07/23/2015
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
## 2015-07-23 NOTE — Care Management Note (Signed)
Case Management Note  Patient Details  Name: [name] MRN: [id number] Date of Birth: [birth year]  Subjective/Objective:                    Action/Plan:   Expected Discharge Date:                  Expected Discharge Plan:  [place]  In-House Referral:     Discharge planning Services     Post Acute Care Choice:    Choice offered to:     DME Arranged:    DME Agency:     HH Arranged:    [place] [facility] [place] Agency:     Status of Service:  In process, will continue to follow  Medicare Important Message Given:    Date Medicare IM Given:    Medicare IM give by:    Date Additional Medicare IM Given:    Additional Medicare Important Message give by:     If discussed at [place] of Stay Meetings, dates discussed:    Additional Comments:  Marilu Favre, RN 07/23/2015, 1:55 PM
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
## 2015-07-23 NOTE — Progress Notes (Signed)
117ml fent wasted with Rhina Brackett, RN. Brayen Bunn L
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
## 2015-07-23 NOTE — Progress Notes (Signed)
Inpatient Rehabilitation  Patient was screened by Tegan Britain for appropriateness for an Inpatient Acute Rehab consult.  At this time, we are recommending Inpatient Rehab consult.  Please order consult if you are agreeable.  Forrest Jaroszewski PT Inpatient Rehab Admissions Coordinator Cell [phone] Office [phone]    

## 2015-07-23 NOTE — Progress Notes (Signed)
I met with pt at bedside to discuss a possible inpt rehab admission pending insurance approval. Pt verifies that she has [place] . I will begin authorization and am hopeful to admit tomorrow. [phone]

## 2015-07-24 ENCOUNTER — Inpatient Hospital Stay (HOSPITAL_COMMUNITY)
Admission: RE | Admit: 2015-07-24 | Discharge: 2015-07-31 | DRG: 560 | Disposition: A | Discharge status: Home / Self Care | Payer: BLUE CROSS/BLUE SHIELD | Source: Intra-hospital | Attending: Physical Medicine & Rehabilitation | Admitting: Physical Medicine & Rehabilitation

## 2015-07-24 ENCOUNTER — Encounter (HOSPITAL_COMMUNITY): Payer: Self-pay | Admitting: *Deleted

## 2015-07-24 DIAGNOSIS — T148 Other injury of unspecified body region: Secondary | ICD-10-CM | POA: Diagnosis not present

## 2015-07-24 DIAGNOSIS — S12000A Unspecified displaced fracture of first cervical vertebra, initial encounter for closed fracture: Secondary | ICD-10-CM | POA: Diagnosis not present

## 2015-07-24 DIAGNOSIS — I10 Essential (primary) hypertension: Secondary | ICD-10-CM | POA: Diagnosis present

## 2015-07-24 DIAGNOSIS — Z8249 Family history of ischemic heart disease and other diseases of the circulatory system: Secondary | ICD-10-CM | POA: Diagnosis not present

## 2015-07-24 DIAGNOSIS — S0181XD Laceration without foreign body of other part of head, subsequent encounter: Secondary | ICD-10-CM

## 2015-07-24 DIAGNOSIS — R609 Edema, unspecified: Secondary | ICD-10-CM | POA: Diagnosis not present

## 2015-07-24 DIAGNOSIS — S025XXS Fracture of tooth (traumatic), sequela: Secondary | ICD-10-CM

## 2015-07-24 DIAGNOSIS — S12001D Unspecified nondisplaced fracture of first cervical vertebra, subsequent encounter for fracture with routine healing: Principal | ICD-10-CM

## 2015-07-24 DIAGNOSIS — K589 Irritable bowel syndrome without diarrhea: Secondary | ICD-10-CM | POA: Diagnosis present

## 2015-07-24 DIAGNOSIS — R000 Tachycardia, unspecified: Secondary | ICD-10-CM | POA: Diagnosis not present

## 2015-07-24 DIAGNOSIS — S51812D Laceration without foreign body of left forearm, subsequent encounter: Secondary | ICD-10-CM | POA: Diagnosis not present

## 2015-07-24 DIAGNOSIS — S022XXD Fracture of nasal bones, subsequent encounter for fracture with routine healing: Secondary | ICD-10-CM | POA: Diagnosis not present

## 2015-07-24 DIAGNOSIS — F329 Major depressive disorder, single episode, unspecified: Secondary | ICD-10-CM

## 2015-07-24 DIAGNOSIS — E876 Hypokalemia: Secondary | ICD-10-CM | POA: Diagnosis not present

## 2015-07-24 DIAGNOSIS — S12000S Unspecified displaced fracture of first cervical vertebra, sequela: Secondary | ICD-10-CM

## 2015-07-24 DIAGNOSIS — R1310 Dysphagia, unspecified: Secondary | ICD-10-CM | POA: Diagnosis present

## 2015-07-24 DIAGNOSIS — J45909 Unspecified asthma, uncomplicated: Secondary | ICD-10-CM | POA: Diagnosis present

## 2015-07-24 DIAGNOSIS — F4323 Adjustment disorder with mixed anxiety and depressed mood: Secondary | ICD-10-CM | POA: Diagnosis present

## 2015-07-24 DIAGNOSIS — D62 Acute posthemorrhagic anemia: Secondary | ICD-10-CM

## 2015-07-24 DIAGNOSIS — S0181XS Laceration without foreign body of other part of head, sequela: Secondary | ICD-10-CM

## 2015-07-24 DIAGNOSIS — I959 Hypotension, unspecified: Secondary | ICD-10-CM | POA: Diagnosis not present

## 2015-07-24 DIAGNOSIS — S12090S Other displaced fracture of first cervical vertebra, sequela: Secondary | ICD-10-CM | POA: Diagnosis not present

## 2015-07-24 DIAGNOSIS — R030 Elevated blood-pressure reading, without diagnosis of hypertension: Secondary | ICD-10-CM | POA: Diagnosis not present

## 2015-07-24 DIAGNOSIS — K5903 Drug induced constipation: Secondary | ICD-10-CM

## 2015-07-24 DIAGNOSIS — R52 Pain, unspecified: Secondary | ICD-10-CM

## 2015-07-24 LAB — CBC WITH DIFFERENTIAL/PLATELET
BASOS ABS: 0 10*3/uL (ref 0.0–0.1)
BASOS PCT: 0 %
EOS PCT: 0 %
Eosinophils Absolute: 0 10*3/uL (ref 0.0–0.7)
HCT: 26.7 % — ABNORMAL LOW (ref 36.0–46.0)
Hemoglobin: 8.3 g/dL — ABNORMAL LOW (ref 12.0–15.0)
Lymphocytes Relative: 11 %
Lymphs Abs: 1.8 10*3/uL (ref 0.7–4.0)
MCH: 24.9 pg — ABNORMAL LOW (ref 26.0–34.0)
MCHC: 31.1 g/dL (ref 30.0–36.0)
MCV: 79.9 fL (ref 78.0–100.0)
MONO ABS: 1 10*3/uL (ref 0.1–1.0)
Monocytes Relative: 6 %
Neutro Abs: 14.1 10*3/uL — ABNORMAL HIGH (ref 1.7–7.7)
Neutrophils Relative %: 83 %
PLATELETS: 516 10*3/uL — AB (ref 150–400)
RBC: 3.34 MIL/uL — ABNORMAL LOW (ref 3.87–5.11)
RDW: 14.6 % (ref 11.5–15.5)
WBC: 16.9 10*3/uL — AB (ref 4.0–10.5)

## 2015-07-24 LAB — BASIC METABOLIC PANEL
ANION GAP: 13 (ref 5–15)
BUN: 20 mg/dL (ref 6–20)
CALCIUM: 9.4 mg/dL (ref 8.9–10.3)
CO2: 22 mmol/L (ref 22–32)
Chloride: 100 mmol/L — ABNORMAL LOW (ref 101–111)
Creatinine, Ser: 0.95 mg/dL (ref 0.44–1.00)
Glucose, Bld: 137 mg/dL — ABNORMAL HIGH (ref 65–99)
Potassium: 4 mmol/L (ref 3.5–5.1)
SODIUM: 135 mmol/L (ref 135–145)

## 2015-07-24 MED ORDER — SORBITOL 70 % SOLN: 30.0000 mL | Freq: Every day | Status: DC | PRN

## 2015-07-24 MED ORDER — POLYETHYLENE GLYCOL 3350 17 G PO PACK
17.0000 g | PACK | Freq: Every day | ORAL | Status: DC
  Administered 2015-07-25 – 2015-07-26 (×2): 17 g via ORAL
  Filled 2015-07-24 (×3): qty 1

## 2015-07-24 MED ORDER — ALPRAZOLAM 0.25 MG PO TABS: 0.5000 mg | ORAL_TABLET | Freq: Two times a day (BID) | ORAL | Status: DC | PRN

## 2015-07-24 MED ORDER — OXYCODONE HCL 5 MG PO TABS
5.0000 mg | ORAL_TABLET | ORAL | Status: DC | PRN
  Administered 2015-07-24 – 2015-07-25 (×3): 15 mg via ORAL
  Administered 2015-07-25: 10 mg via ORAL
  Administered 2015-07-25: 15 mg via ORAL
  Administered 2015-07-26: 5 mg via ORAL
  Administered 2015-07-26: 10 mg via ORAL
  Filled 2015-07-24: qty 2
  Filled 2015-07-24: qty 1
  Filled 2015-07-24: qty 2
  Filled 2015-07-24: qty 3
  Filled 2015-07-24: qty 2
  Filled 2015-07-24 (×3): qty 3

## 2015-07-24 MED ORDER — OXYCODONE HCL 5 MG PO TABS
5.0000 mg | ORAL_TABLET | ORAL | Status: DC | PRN
  Administered 2015-07-24: 15 mg via ORAL
  Filled 2015-07-24: qty 3

## 2015-07-24 MED ORDER — ONDANSETRON HCL 4 MG/2ML IJ SOLN: 4.0000 mg | Freq: Four times a day (QID) | INTRAMUSCULAR | Status: DC | PRN

## 2015-07-24 MED ORDER — PANTOPRAZOLE SODIUM 40 MG PO TBEC
40.0000 mg | DELAYED_RELEASE_TABLET | Freq: Every day | ORAL | Status: DC
Start: 2015-07-24 — End: 2015-07-24
  Administered 2015-07-24: 40 mg via ORAL
  Filled 2015-07-24: qty 1

## 2015-07-24 MED ORDER — ENOXAPARIN SODIUM 40 MG/0.4ML ~~LOC~~ SOLN
40.0000 mg | SUBCUTANEOUS | Status: DC
  Administered 2015-07-25 – 2015-07-30 (×6): 40 mg via SUBCUTANEOUS
  Filled 2015-07-24 (×6): qty 0.4

## 2015-07-24 MED ORDER — ACETAMINOPHEN 325 MG PO TABS
325.0000 mg | ORAL_TABLET | ORAL | Status: DC | PRN
  Administered 2015-07-29: 650 mg via ORAL
  Filled 2015-07-24: qty 2

## 2015-07-24 MED ORDER — DOCUSATE SODIUM 100 MG PO CAPS: 100.0000 mg | ORAL_CAPSULE | Freq: Two times a day (BID) | ORAL | Status: DC

## 2015-07-24 MED ORDER — PANTOPRAZOLE SODIUM 40 MG PO TBEC: 40.0000 mg | DELAYED_RELEASE_TABLET | Freq: Every day | ORAL | Status: DC

## 2015-07-24 MED ORDER — SERTRALINE HCL 50 MG PO TABS
50.0000 mg | ORAL_TABLET | Freq: Every day | ORAL | Status: DC
  Filled 2015-07-24 (×2): qty 1

## 2015-07-24 MED ORDER — DOCUSATE SODIUM 50 MG/5ML PO LIQD
100.0000 mg | Freq: Two times a day (BID) | ORAL | Status: DC
  Administered 2015-07-24: 100 mg via ORAL
  Filled 2015-07-24: qty 10

## 2015-07-24 MED ORDER — ALBUTEROL SULFATE (2.5 MG/3ML) 0.083% IN NEBU: 2.5000 mg | INHALATION_SOLUTION | Freq: Four times a day (QID) | RESPIRATORY_TRACT | Status: DC | PRN

## 2015-07-24 MED ORDER — MUPIROCIN 2 % EX OINT
TOPICAL_OINTMENT | Freq: Two times a day (BID) | CUTANEOUS | Status: DC
  Administered 2015-07-24 – 2015-07-27 (×6): via NASAL
  Administered 2015-07-27: 1 via NASAL
  Administered 2015-07-28 – 2015-07-29 (×5): via NASAL
  Filled 2015-07-24: qty 22

## 2015-07-24 MED ORDER — DOCUSATE SODIUM 100 MG PO CAPS
100.0000 mg | ORAL_CAPSULE | Freq: Two times a day (BID) | ORAL | Status: DC
  Administered 2015-07-24: 100 mg via ORAL
  Filled 2015-07-24: qty 1

## 2015-07-24 MED ORDER — RACEPINEPHRINE HCL 2.25 % IN NEBU
0.5000 mL | INHALATION_SOLUTION | RESPIRATORY_TRACT | Status: DC | PRN
  Filled 2015-07-24: qty 0.5

## 2015-07-24 MED ORDER — ERYTHROMYCIN 5 MG/GM OP OINT
TOPICAL_OINTMENT | Freq: Three times a day (TID) | OPHTHALMIC | Status: DC
  Administered 2015-07-25 – 2015-07-29 (×12): via OPHTHALMIC
  Filled 2015-07-24: qty 3.5

## 2015-07-24 MED ORDER — ENOXAPARIN SODIUM 40 MG/0.4ML ~~LOC~~ SOLN: 40.0000 mg | SUBCUTANEOUS | Status: DC

## 2015-07-24 MED ORDER — ONDANSETRON HCL 4 MG PO TABS: 4.0000 mg | ORAL_TABLET | Freq: Four times a day (QID) | ORAL | Status: DC | PRN

## 2015-07-24 MED ORDER — PANTOPRAZOLE SODIUM 40 MG PO PACK: 40.0000 mg | PACK | Freq: Every day | ORAL | Status: DC

## 2015-07-24 MED ORDER — HYDROMORPHONE HCL 1 MG/ML IJ SOLN: 0.5000 mg | INTRAMUSCULAR | Status: DC | PRN

## 2015-07-24 MED ORDER — BISOPROLOL-HYDROCHLOROTHIAZIDE 5-6.25 MG PO TABS
1.0000 | ORAL_TABLET | Freq: Every day | ORAL | Status: DC
  Administered 2015-07-25 – 2015-07-26 (×2): 1 via ORAL
  Filled 2015-07-24 (×5): qty 1
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
## 2015-07-24 NOTE — Progress Notes (Signed)
I have insurance approval to admit pt to inpt rehab today. Pt is in agreement. RN CM is aware. I will alert Trauma PA and make arrangements to admit today. [id number]
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
## 2015-07-24 NOTE — Interval H&P Note (Signed)
[name] was admitted today to Inpatient Rehabilitation with the diagnosis of nondisplaced C1 fracture, complex laceration left forearm.  The patient's history has been reviewed, patient examined, and there is no change in status.  Patient continues to be appropriate for intensive inpatient rehabilitation.  I have reviewed the patient's chart and labs.  Questions were answered to the patient's satisfaction. The PAPE has been reviewed and assessment remains appropriate.  [name] 07/24/2015, 11:41 PM
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
## 2015-07-24 NOTE — Progress Notes (Signed)
OT Cancellation Note  Patient Details Name: RICKIYAH GREENWALD MRN: [id number] DOB: 1975-05-27   Cancelled Treatment:    Reason Eval/Treat Not Completed: Other (comment) (Plan for d/c to CIR today; will defer OT to next venue.)  Binnie Kand M.S., OTR/L Pager: [phone]  07/24/2015, 1:57 PM
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
## 2015-07-24 NOTE — Progress Notes (Signed)
Patient ID: [name], female   DOB: [birth year], 40 y.o.   MRN: [id number] Patient arrived via 6N via hospital bed with belongings. Oriented to room, rehab process, fall prevention plan, rehab safety plan, health resource notebook, and nurse call system. Patient given 15mg  Oxy IR for pain. Patient resting comfortably in bed with family in room and call bell at side.

## 2015-07-25 ENCOUNTER — Inpatient Hospital Stay (HOSPITAL_COMMUNITY): Payer: BLUE CROSS/BLUE SHIELD | Admitting: Speech Pathology

## 2015-07-25 ENCOUNTER — Inpatient Hospital Stay (HOSPITAL_COMMUNITY): Payer: BLUE CROSS/BLUE SHIELD

## 2015-07-25 ENCOUNTER — Inpatient Hospital Stay (HOSPITAL_COMMUNITY): Payer: BLUE CROSS/BLUE SHIELD | Admitting: *Deleted

## 2015-07-25 ENCOUNTER — Inpatient Hospital Stay (HOSPITAL_COMMUNITY): Payer: BLUE CROSS/BLUE SHIELD | Admitting: Occupational Therapy

## 2015-07-25 DIAGNOSIS — R5381 Other malaise: Secondary | ICD-10-CM

## 2015-07-25 DIAGNOSIS — S12000A Unspecified displaced fracture of first cervical vertebra, initial encounter for closed fracture: Secondary | ICD-10-CM

## 2015-07-25 DIAGNOSIS — R609 Edema, unspecified: Secondary | ICD-10-CM

## 2015-07-25 LAB — COMPREHENSIVE METABOLIC PANEL
ALT: 29 U/L (ref 14–54)
ANION GAP: 11 (ref 5–15)
AST: 19 U/L (ref 15–41)
Albumin: 2.6 g/dL — ABNORMAL LOW (ref 3.5–5.0)
Alkaline Phosphatase: 53 U/L (ref 38–126)
BILIRUBIN TOTAL: 0.8 mg/dL (ref 0.3–1.2)
BUN: 19 mg/dL (ref 6–20)
CO2: 24 mmol/L (ref 22–32)
Calcium: 9 mg/dL (ref 8.9–10.3)
Chloride: 100 mmol/L — ABNORMAL LOW (ref 101–111)
Creatinine, Ser: 1.02 mg/dL — ABNORMAL HIGH (ref 0.44–1.00)
Glucose, Bld: 119 mg/dL — ABNORMAL HIGH (ref 65–99)
POTASSIUM: 3.6 mmol/L (ref 3.5–5.1)
Sodium: 135 mmol/L (ref 135–145)
TOTAL PROTEIN: 6.9 g/dL (ref 6.5–8.1)

## 2015-07-25 LAB — CBC WITH DIFFERENTIAL/PLATELET
BASOS PCT: 0 %
Basophils Absolute: 0 10*3/uL (ref 0.0–0.1)
EOS PCT: 0 %
Eosinophils Absolute: 0 10*3/uL (ref 0.0–0.7)
HEMATOCRIT: 27.5 % — AB (ref 36.0–46.0)
Hemoglobin: 8.4 g/dL — ABNORMAL LOW (ref 12.0–15.0)
LYMPHS ABS: 3.4 10*3/uL (ref 0.7–4.0)
Lymphocytes Relative: 26 %
MCH: 24.4 pg — ABNORMAL LOW (ref 26.0–34.0)
MCHC: 30.5 g/dL (ref 30.0–36.0)
MCV: 79.9 fL (ref 78.0–100.0)
MONO ABS: 1.7 10*3/uL — AB (ref 0.1–1.0)
Monocytes Relative: 13 %
Neutro Abs: 8.1 10*3/uL — ABNORMAL HIGH (ref 1.7–7.7)
Neutrophils Relative %: 61 %
Platelets: 572 10*3/uL — ABNORMAL HIGH (ref 150–400)
RBC: 3.44 MIL/uL — ABNORMAL LOW (ref 3.87–5.11)
RDW: 14.8 % (ref 11.5–15.5)
WBC: 13.2 10*3/uL — AB (ref 4.0–10.5)

## 2015-07-25 MED ORDER — SENNOSIDES-DOCUSATE SODIUM 8.6-50 MG PO TABS
2.0000 | ORAL_TABLET | Freq: Every day | ORAL | Status: DC
  Administered 2015-07-25: 2 via ORAL
  Filled 2015-07-25 (×2): qty 2

## 2015-07-25 MED ORDER — SERTRALINE HCL 50 MG PO TABS
50.0000 mg | ORAL_TABLET | Freq: Every day | ORAL | Status: DC
  Filled 2015-07-25 (×2): qty 1

## 2015-07-25 MED ORDER — BISACODYL 10 MG RE SUPP
10.0000 mg | Freq: Every day | RECTAL | Status: DC | PRN
  Administered 2015-07-25: 10 mg via RECTAL
  Filled 2015-07-25: qty 1

## 2015-07-25 MED ORDER — PANTOPRAZOLE SODIUM 40 MG PO TBEC
40.0000 mg | DELAYED_RELEASE_TABLET | Freq: Every day | ORAL | Status: DC
  Administered 2015-07-25 – 2015-07-31 (×7): 40 mg via ORAL
  Filled 2015-07-25 (×7): qty 1
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
## 2015-07-25 NOTE — Progress Notes (Signed)
Occupational Therapy Session Note  Patient Details  Name: SHAURI ELLINGHAM MRN: [id number] Date of Birth: 1976-03-31  Today's Date: 07/25/2015 OT Individual Time: 1435-1501 OT Individual Time Calculation (min): 26 min    Short Term Goals: Week 1:    STG=LTG due to LOS  Skilled Therapeutic Interventions/Progress Updates:    Pt seen for OT session focusing on functional ambulation, functional transfers, and standing balance/ endurance. Pt in supine upon arrival with RN present administering medications. Pt agreeable to tx session. She ambulated throughout unit with SPC with CGA.  In ADL apartment, pt completed simulated tub/shower transfer utilizing tub bench. Educated regarding bathroom DME and activity progression. She then completed functional transfer sit <> stand from low soft surface couch with supervision. In kitchen, pt removed/ replaced items from overhead cabinet with CGA demonstrating good functional standing balance and endurance. Pt returned to room at end of session and requested need for toileting task. Supervision- CGA A required for standing toileting tasks. She returned to sitting EOB at end of session with all needs in reach and parents present.  Pt and parents educated regarding role of OT, OT goals, and d/c planning.   Therapy Documentation Precautions:  Precautions Precautions: Fall, Cervical Required Braces or Orthoses: Cervical Brace Cervical Brace: Hard collar, At all times Restrictions Weight Bearing Restrictions: Yes LUE Weight Bearing: Weight bear through elbow only Pain: Pain Assessment Pain Assessment: 0-10 Pain Score: 2  Pain Type: Acute pain Pain Location: Neck Pain Intervention(s): Medication (See eMAR) (pt pre-medicated directly prior to tx session)  See Function Navigator for Current Functional Status.   Therapy/Group: Individual Therapy  Lewis, Kelilah Hebard C 07/25/2015, 3:25 PM
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
## 2015-07-25 NOTE — Progress Notes (Signed)
  Subjective: Patient was in her room getting a bath.  No complaints. The swelling and bruising in her face is improving.  She has good movement of her obicularis oris.  No sign of infection.  Sutures are removable.     Objective: Vital signs in last 24 hours: Temp:  [98.8 F (37.1 C)-99.7 F (37.6 C)] 98.8 F (37.1 C) (04/20 0520) Pulse Rate:  [67-72] 67 (04/20 0520) Resp:  [17-18] 17 (04/20 0520) BP: (139-153)/(86-90) 140/86 mmHg (04/20 0520) SpO2:  [97 %-100 %] 100 % (04/20 0520) Weight:  [83.915 kg (185 lb)] 83.915 kg (185 lb) (04/19 1748) Last BM Date:  (unknown, PTA)  Intake/Output from previous day: 04/19 0701 - 04/20 0700 In: -  Out: 600 [Urine:600] Intake/Output this shift: Total I/O In: 60 [P.O.:60] Out: 225 [Urine:225]  General appearance: alert, cooperative and no distress Head: Normocephalic, without obvious abnormality Incision/Wound: no sign of infection.  Lab Results:   Recent Labs  07/24/15 0557 07/25/15 0530  WBC 16.9* 13.2*  HGB 8.3* 8.4*  HCT 26.7* 27.5*  PLT 516* 572*   BMET  Recent Labs  07/24/15 0557 07/25/15 0530  NA 135 135  K 4.0 3.6  CL 100* 100*  CO2 22 24  GLUCOSE 137* 119*  BUN 20 19  CREATININE 0.95 1.02*  CALCIUM 9.4 9.0   PT/INR No results for input(s): LABPROT, INR in the last 72 hours. ABG No results for input(s): PHART, HCO3 in the last 72 hours.  Invalid input(s): PCO2, PO2  Studies/Results: No results found.  Anti-infectives: Anti-infectives    None      Assessment/Plan: s/p * No surgery found * Plan to see me in the office in one week.  LOS: 1 day    Wallace Going 07/25/2015
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
## 2015-07-25 NOTE — Care Management Note (Signed)
[facility] Individual Statement of Services  Patient Name:  [name]  Date:  07/25/2015  Welcome to the [facility].  Our goal is to provide you with an individualized program based on your diagnosis and situation, designed to meet your specific needs.  With this comprehensive rehabilitation program, you will be expected to participate in at least 3 hours of rehabilitation therapies Monday-Friday, with modified therapy programming on the weekends.  Your rehabilitation program will include the following services:  Physical Therapy (PT), Occupational Therapy (OT), 24 hour per day rehabilitation nursing, Therapeutic Recreaction (TR), Neuropsychology, Case Management (Social Worker), Rehabilitation Medicine, Nutrition Services and Pharmacy Services  Weekly team conferences will be held on Wednesday to discuss your progress.  Your Social Worker will talk with you frequently to get your input and to update you on team discussions.  Team conferences with you and your family in attendance may also be held.  Expected length of stay: 7 days Overall anticipated outcome: supervision/mod/i level  Depending on your progress and recovery, your program may change. Your Social Worker will coordinate services and will keep you informed of any changes. Your Social Worker's name and contact numbers are listed  below.  The following services may also be recommended but are not provided by the [facility] will be made to provide these services after discharge if needed.  Arrangements include referral to agencies that provide these services.  Your insurance has been verified to be:  [place] Your primary doctor is:  Unk Pinto  Pertinent information will be shared with your doctor and your insurance  company.  Social Worker:  Ovidio Kin, [place] or ([phone]  Information discussed with and copy given to patient by: Elease Hashimoto, 07/25/2015, 11:21 AM

## 2015-07-25 NOTE — Progress Notes (Signed)
*  Preliminary Results* Bilateral lower extremity venous duplex completed. Bilateral lower extremities are negative for deep vein thrombosis. There is no evidence of Baker's cyst bilaterally.  07/25/2015  Maudry Mayhew, RVT, RDCS, RDMS
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
## 2015-07-26 ENCOUNTER — Inpatient Hospital Stay (HOSPITAL_COMMUNITY): Payer: BLUE CROSS/BLUE SHIELD | Admitting: Physical Therapy

## 2015-07-26 ENCOUNTER — Inpatient Hospital Stay (HOSPITAL_COMMUNITY): Payer: BLUE CROSS/BLUE SHIELD | Admitting: Speech Pathology

## 2015-07-26 ENCOUNTER — Inpatient Hospital Stay (HOSPITAL_COMMUNITY): Payer: BLUE CROSS/BLUE SHIELD | Admitting: Occupational Therapy

## 2015-07-26 DIAGNOSIS — I10 Essential (primary) hypertension: Secondary | ICD-10-CM | POA: Insufficient documentation

## 2015-07-26 MED ORDER — POLYVINYL ALCOHOL 1.4 % OP SOLN
2.0000 [drp] | OPHTHALMIC | Status: DC | PRN
  Filled 2015-07-26: qty 15

## 2015-07-26 MED ORDER — OXYCODONE HCL 5 MG PO TABS
5.0000 mg | ORAL_TABLET | ORAL | Status: DC | PRN
  Administered 2015-07-26 – 2015-07-31 (×14): 5 mg via ORAL
  Filled 2015-07-26 (×15): qty 1
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
## 2015-07-26 NOTE — Progress Notes (Signed)
Social Work Patient ID: [name], female   DOB: 02-13-1976, 40 y.o.   MRN: [id number] Have faxed Mom's FMLA forms and given them back to her. Pt awaiting her forms in the mail and when arrives will bring them in to be completed. Pt doing well in therapies and making good progress. Parents have been here and observed while in therapies.

## 2015-07-26 NOTE — Progress Notes (Signed)
Physical Therapy Session Note  Patient Details  Name: [name] MRN: [id number] Date of Birth: [birth year]  Today's Date: 07/26/2015 PT Individual Time: 1105-1200 PT Individual Time Calculation (min): 55 min   Short Term Goals: Week 1:  PT Short Term Goal 1 (Week 1): STG=LTG, Mod I overall  Skilled Therapeutic Interventions/Progress Updates:    Pt received ambulating in room with NT present; denies pain and agreeable to treatment. Reports she had low BP earlier, however is feeling better now with TEDs and wants to try ambulating to gym. Min guard for gait x150'. BP assessed 117/70 HR 82 bpm. Nustep x10 min with BLE, RUE on level 6 for strengthening and endurance. Seated and supine LE strengthening exercises performed for 2 sets 10 reps. Pt given handout with LE exercises for performance in room and after d/c. Sit <>supine with S and increased time, verbal cues for log roll technique. Sit <>stand 2x10, 2x15 heel raises, and 2x10 hip abduction in standing; pt educated not to perform standing exercises when alone in room, only sitting/supine and pt agreeable. Returned to room with gait S as above. Remained seated in recliner at completion of session, all needs within reach.   Therapy Documentation Precautions:  Precautions Precautions: Fall, Cervical Required Braces or Orthoses: Cervical Brace Cervical Brace: Hard collar, At all times Restrictions Weight Bearing Restrictions: Yes LUE Weight Bearing: Weight bearing as tolerated   See Function Navigator for Current Functional Status.   Therapy/Group: Individual Therapy  Luberta Mutter 07/26/2015, 12:21 PM

## 2015-07-26 NOTE — Progress Notes (Signed)
Physical Therapy Session Note  Patient Details  Name: [name] MRN: [id number] Date of Birth: 04/10/1975  Today's Date: 07/26/2015 PT Individual Time: 1430-1545 PT Individual Time Calculation (min): 75 min   Short Term Goals: Week 1:  PT Short Term Goal 1 (Week 1): STG=LTG, Mod I overall  Skilled Therapeutic Interventions/Progress Updates:     Patient received supine in bed and agreeable to PT. Supine>sit transfer with supervision A and cues to maintain cervical precautions. Gait training for 143f, 2063f [id number] AD and supervision A on level surface. Gait on uneven surface for [id number] supervision A from PT and cues for improved foot clearance as needed. Patient noted to have decreased foot clearance bilaterally, decreased stride length, and held LUE in dependent position with all functional mobility. Patient required several rest break durrng gait training  Due to increased fatigue in BLE.  BP assessed prior to gait 130/85 and following gait training 125/84. No reports of orthostasis throughout treatment.   Balance training.  Standing on Airex pad normal BOS 2x1 minute. Narrow BOS 2x 1 minutes. Normal BOS with eyes closed 2x30 seconds. PT provided Supervision A  and min A x 3 to prevent posterior LOB for balance training on airex. Toe taps on one 6 inch cone x 10 BLE. Toe taps on 2 cones x 8 BLE. Alternating toe taps on 6 inch step x 12 BLE. Step ups to 6 inch step without UE support x 5 BLE.  PT provided constant min A for improved safety with all intermitant single leg stance tasks for improved safety and max A x 3 to prevent lateral and posterior LOB. Patient also performed balance training on Wii fit with penguin game x1, soccer x 1 and bubble run x 2, Close supervision A provided by PT for Wii fit balance tasks as well as cues to improve us oKoreahip strategy to shift weight and maintain balance.  Throughout treatment Patient performed sit<>stand transfer with Supervision A  for safety.  Patient returned to room and performed bed mobility to supine with supervision A from PT. Call bell was left within reach and all other needs met.   Therapy Documentation Precautions:  Precautions Precautions: Fall, Cervical Required Braces or Orthoses: Cervical Brace Cervical Brace: Hard collar, At all times Restrictions Weight Bearing Restrictions: Yes LUE Weight Bearing: Weight bearing as tolerated   See Function Navigator for Current Functional Status.   Therapy/Group: Individual Therapy  AustLorie Phenix1/2017, 5:57 PM

## 2015-07-26 NOTE — Progress Notes (Signed)
Occupational Therapy Session Note  Patient Details  Name: [name] MRN: [id number] Date of Birth: [birth year]  Today's Date: 07/26/2015 OT Individual Time: [id number] OT Individual Time Calculation (min): 55 min   Short Term Goals: Week 1:  OT Short Term Goal 1 (Week 1): STGs=LTGs secondary to estimated LOS  Skilled Therapeutic Interventions/Progress Updates:  Upon entering room, pt found seated up in bed ready and willing to work with therapist. Pt with hard collar donned. Pt with no complaints of pain. Pt engaged in bed mobility, sat EOB, then ambulated to BR for toileting needs. Pt able to perform peri care and clothing management with supervision. Pt then gathered wash cloths and towel for bathing activity at sink. Pt ambulated to sink and started performing UB bathing. During this task, pt with complaints of lightheadedness, Pt's BP=84/39 seated in w/c after complaints of lightheadedness. After therapist took BP, pt became slightly unresponsive, with a delay in responding to therapists questions. Therapist immediately propelled pt back towards bed and pt performed stand pivot transfer w/c to EOB, EOB to supine. Once in supine, patient stated she felt a lot better. BP=93/54 in supine. Let pt take rest break, and went to talk with patient's nurse and PA regarding her lightheadedness, unresponsiveness, and low BP. Once therapist back in room, pt sat EOB to finish UB bathing and dressing. RN present taking blood pressures and pt with complaints of lightheadedness again and again became slightly unresponsive. Immediately had pt lay back in supine. Pt felt better once in supine. Therapist handed patient her pants and pt donned pants in supine, bridging to pull pants up to waist. Therapist donned bilateral thigh high TEDs. At end of session, left pt supine in bed with all needs within reach.   Therapy Documentation Precautions:  Precautions Precautions: Fall, Cervical Required Braces or  Orthoses: Cervical Brace Cervical Brace: Hard collar, At all times Restrictions Weight Bearing Restrictions: Yes LUE Weight Bearing: Weight bear through elbow only  Vital Signs: Therapy Vitals Temp: 99.2 F (37.3 C) Temp Source: Oral Pulse Rate: 95 Resp: 16 BP: 125/69 mmHg Patient Position (if appropriate): Sitting Oxygen Therapy SpO2: 100 % O2 Device: Not Delivered  See Function Navigator for Current Functional Status.  Therapy/Group: Individual Therapy  Chrys Racer , MS, OTR/L, CLT Pager: [phone]  07/26/2015, 12:09 PM
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
## 2015-07-26 NOTE — Progress Notes (Signed)
Speech Language Pathology Daily Session Note  Patient Details  Name: [name] MRN: [id number] Date of Birth: 04/17/1975  Today's Date: 07/26/2015 SLP Individual Time: 0730-0800 SLP Individual Time Calculation (min): 30 min  Short Term Goals: Week 1: SLP Short Term Goal 1 (Week 1): Pt to tolerate foods requiring mastication with oral clearance with use of compensatory strategies at mod I SLP Short Term Goal 2 (Week 1): Pt to complete complex reasoning tasks at mod I SLP Short Term Goal 3 (Week 1): Pt to complete complex alternating and divided attention tasks at mod I  Skilled Therapeutic Interventions: Pt seen for skilled dysphagia therapy sitting upright on EOB. Pt able to manage thin liquids via straw and grits with self-initiated liquid wash to clear oral cavity. Pt declined any advanced PO trials, but demonstrated improved intake this a.m. and  no s/s aspiration. Will continue to follow and advance as appropriate.   Function:  Eating Eating   Modified Consistency Diet: Yes Eating Assist Level: More than reasonable amount of time           Cognition Comprehension Comprehension assist level: Follows complex conversation/direction with extra time/assistive device  Expression   Expression assist level: Expresses complex ideas: With extra time/assistive device  Social Interaction Social Interaction assist level: Interacts appropriately with others with medication or extra time (anti-anxiety, antidepressant).  Problem Solving Problem solving assist level: Solves complex problems: With extra time  Memory Memory assist level: More than reasonable amount of time    Pain Pain Assessment Pain Assessment: No/denies pain  Therapy/Group: Individual Therapy  Vinetta Bergamo MA, CCC-SLP 07/26/2015, 12:38 PM

## 2015-07-27 ENCOUNTER — Inpatient Hospital Stay (HOSPITAL_COMMUNITY): Payer: BLUE CROSS/BLUE SHIELD | Admitting: Speech Pathology

## 2015-07-27 ENCOUNTER — Inpatient Hospital Stay (HOSPITAL_COMMUNITY): Payer: BLUE CROSS/BLUE SHIELD | Admitting: Occupational Therapy

## 2015-07-27 ENCOUNTER — Inpatient Hospital Stay (HOSPITAL_COMMUNITY): Payer: BLUE CROSS/BLUE SHIELD | Admitting: Physical Therapy

## 2015-07-27 MED ORDER — POLYETHYLENE GLYCOL 3350 17 G PO PACK: 17.0000 g | PACK | Freq: Every day | ORAL | Status: DC | PRN

## 2015-07-27 MED ORDER — SENNOSIDES-DOCUSATE SODIUM 8.6-50 MG PO TABS: 2.0000 | ORAL_TABLET | Freq: Every evening | ORAL | Status: DC | PRN
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
## 2015-07-27 NOTE — Progress Notes (Signed)
Speech Language Pathology Daily Session Note  Patient Details  Name: [name] MRN: [id number] Date of Birth: 1975/10/11  Today's Date: 07/27/2015 SLP Individual Time: 1335-1400 SLP Individual Time Calculation (min): 25 min  Short Term Goals: Week 1: SLP Short Term Goal 1 (Week 1): Pt to tolerate foods requiring mastication with oral clearance with use of compensatory strategies at mod I SLP Short Term Goal 2 (Week 1): Pt to complete complex reasoning tasks at mod I SLP Short Term Goal 3 (Week 1): Pt to complete complex alternating and divided attention tasks at mod I  Skilled Therapeutic Interventions: Skilled treatment session focused on cognitive goals. SLP facilitated session by providing extra time for completion of a complex reasoning and organization task. Patient completed task with Mod I. Patient left sitting EOB with all needs within reach and family present. Continue with current plan of care.    Function:  Eating Eating   Modified Consistency Diet: Yes Eating Assist Level: No help, No cues           Cognition Comprehension Comprehension assist level: Follows complex conversation/direction with extra time/assistive device  Expression   Expression assist level: Expresses complex ideas: With extra time/assistive device  Social Interaction Social Interaction assist level: Interacts appropriately with others with medication or extra time (anti-anxiety, antidepressant).  Problem Solving Problem solving assist level: Solves complex problems: With extra time  Memory Memory assist level: More than reasonable amount of time    Pain Pain Assessment Pain Assessment: 0-10 Pain Score: 2  Pain Type: Acute pain Pain Location: Neck Pain Descriptors / Indicators: Aching Pain Frequency: Intermittent Pain Onset: On-going Patients Stated Pain Goal: 0 Pain Intervention(s): Medication (See eMAR)  Therapy/Group: Individual Therapy  Harlen Danford 07/27/2015, 3:36  PM
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
## 2015-07-27 NOTE — Progress Notes (Signed)
Physical Therapy Session Note  Patient Details  Name: TAJANAE GUILBAULT MRN: [id number] Date of Birth: Mar 09, 1976  Today's Date: 07/27/2015 PT Individual Time: 1600-1700 PT Individual Time Calculation (min): 60 min   Short Term Goals: Week 1:  PT Short Term Goal 1 (Week 1): STG=LTG, Mod I overall  Skilled Therapeutic Interventions/Progress Updates:    Patient received in bed an agreeable to PT. Gait in hall with supervision A from PT for increased safety and cues for improved trunk mobility as tolerated, 123f. Balance training on airex Normal BOS eyes open/eyes closed 2x 1 minute each  Narrow BOS eyes open/eyes closed 2x 45 seconds each  Alternating Toe taps on 6 inch step x 12 BLE  BLE Toe taps on 1 and 2, 6 inch cones 2x 10 at each level  PT provided supervision to min A with balance training on airex pad with min verbal instruction for increased use of ankle strategey to maintain balance.   PT instructed patient in Obstacle course x 2 with weave through 6 cones, stepping over 1-6 inch bolsters, and stepping up to 6 inch and 8 inch step. Patient provided  supervision to min A with obstacles and min cues for decreased speed with weave through cones and increased foot clearance with stepping over bolsters.  Lightheadedness repotred by pt while standing after 3 minute rest break following obstacle course. Patient insturcted to sit EOB. Vitals Assessed: BP 108/66, reassessed after 1 minute. 105/55, with no light headedness. Patient stood for vital assessment in standing. After standing for 10 seconds, patient became unresponsive and experienced syncopal episode. PT lowered patient to bed to assume supine position and requested assistance from RN. Patient regain conciseness after 20 seconds. BP assessed during episode: 112/84, and following episode in supine: 137/71 and sitting: 120/62.   [facility] and left supine in bed with all needs met.   Therapy  Documentation Precautions:  Precautions Precautions: Fall, Cervical Required Braces or Orthoses: Cervical Brace Cervical Brace: Hard collar, At all times Restrictions Weight Bearing Restrictions: Yes LUE Weight Bearing: Weight bear through elbow only   Pain: Pain Assessment Pain Score: 0-No pain   See Function Navigator for Current Functional Status.   Therapy/Group: Individual Therapy  ALorie Phenix4/22/2017, 6:48 PM

## 2015-07-28 ENCOUNTER — Inpatient Hospital Stay (HOSPITAL_COMMUNITY): Payer: BLUE CROSS/BLUE SHIELD | Admitting: Physical Therapy

## 2015-07-28 DIAGNOSIS — S12090S Other displaced fracture of first cervical vertebra, sequela: Secondary | ICD-10-CM
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
## 2015-07-28 NOTE — Progress Notes (Signed)
Physical Therapy Session Note  Patient Details  Name: [name] MRN: [id number] Date of Birth: 1975/12/01  Today's Date: 07/28/2015 PT Individual Time: 1345-1429 PT Individual Time Calculation (min): 44 min   Short Term Goals: Week 1:  PT Short Term Goal 1 (Week 1): STG=LTG, Mod I overall  Skilled Therapeutic Interventions/Progress Updates:    Pt received in recliner, denying c/o pain & agreeable to PT. PT instructed pt not to push with LUE when pushing to stand from recliner. Pt's orthostatic BP's were assessed (please see below) & pt educated to alert PT if she felt lightheaded, dizzy, etc & pt agreeable. Pt able to complete all transfers with mod I & ambulated from room>gym ~150 ft with supervision fade to Mod I without AD. In gym pt negotiated 4 steps x 2 trials with L ascending rail by ascending steps laterally & holding to rail with RUE. Pt required supervision fade to Mod I for stair negotiation. Gait training through obstacle course requiring pt to weave in/out of objects & step over poles. Pt with good approximation to each pole during activity. Utilized rocker board to challenge balance further by having pt stand on board with eyes open & closed, with anterior/posterior challenges, lateral challenges to balance & perturbations from PT. Pt without LOB during task. Pt completed 5x sit-to-stand without BUE support from 20 inch surface in 11 seconds. Pt then ambulated back to room & used restroom with mod I. At end of session pt left in recliner with all needs within reach.   Therapy Documentation Precautions:  Precautions Precautions: Fall, Cervical Required Braces or Orthoses: Cervical Brace Cervical Brace: Hard collar, At all times Restrictions Weight Bearing Restrictions: Yes LUE Weight Bearing: Weight bearing as tolerated   Vital Signs: Therapy Vitals Patient Position (if appropriate): Orthostatic Vitals Sitting = 134/83 mmHg Standing at 0 minutes = 126/84  mmHg Standing at 2 minutes = 125/83 mmHg Pain: Pain Assessment Pain Assessment: No/denies pain Pain Score: 0-No pain   See Function Navigator for Current Functional Status.   Therapy/Group: Individual Therapy  Waunita Schooner 07/28/2015, 2:59 PM

## 2015-07-29 ENCOUNTER — Inpatient Hospital Stay (HOSPITAL_COMMUNITY): Payer: BLUE CROSS/BLUE SHIELD

## 2015-07-29 ENCOUNTER — Inpatient Hospital Stay (HOSPITAL_COMMUNITY): Payer: BLUE CROSS/BLUE SHIELD | Admitting: Speech Pathology

## 2015-07-29 ENCOUNTER — Inpatient Hospital Stay (HOSPITAL_COMMUNITY): Payer: BLUE CROSS/BLUE SHIELD | Admitting: Physical Therapy

## 2015-07-29 ENCOUNTER — Encounter (HOSPITAL_COMMUNITY): Payer: BLUE CROSS/BLUE SHIELD

## 2015-07-29 DIAGNOSIS — R030 Elevated blood-pressure reading, without diagnosis of hypertension: Secondary | ICD-10-CM

## 2015-07-29 DIAGNOSIS — R000 Tachycardia, unspecified: Secondary | ICD-10-CM

## 2015-07-29 MED ORDER — PROPRANOLOL HCL 10 MG PO TABS
5.0000 mg | ORAL_TABLET | Freq: Three times a day (TID) | ORAL | Status: DC
  Administered 2015-07-29 – 2015-07-31 (×7): 5 mg via ORAL
  Filled 2015-07-29 (×8): qty 0.5

## 2015-07-29 NOTE — Plan of Care (Signed)
Problem: RH Car Transfers Goal: LTG Patient will perform car transfers with assist (PT) LTG: Patient will perform car transfers with assistance (PT).  Upgraded 2/2 pt progress  Problem: RH Ambulation Goal: LTG Patient will ambulate in community environment (PT) LTG: Patient will ambulate in community environment, # of feet with assistance (PT).  Upgraded 2/2 good progress

## 2015-07-29 NOTE — Progress Notes (Signed)
Physical Therapy Session Note  Patient Details  Name: [name] MRN: [id number] Date of Birth: 1976-02-18  Today's Date: 07/29/2015 PT Individual Time: 1412-1506 PT Individual Time Calculation (min): 54 min   Short Term Goals: Week 1:  PT Short Term Goal 1 (Week 1): STG=LTG, Mod I overall  Skilled Therapeutic Interventions/Progress Updates:   Pt received in w/c with family present.  Pt denied pain at rest.  Discussed with pt therapy's request for vestibular evaluation due to pt reporting dizziness with rolling.  Demonstrated to pt how therapist would perform modified testing and treatment positions with bed in trendelenburg position.  Pt requested to hold on vestibular evaluation for now due to pain/discomfort in her neck when in supine position and in LUE when rolling to L side.  Pt states that when she D/C home she will likely sleep in her recliner instead of her bed due to neck pain in supine.  Discussed with pt process of referral to outpatient neuro PT or OT for full vestibular evaluation if she continues to experience vertigo once her cervical spine has healed and is cleared for ROM.  Pt verbalized understanding.  Pt did begin to report mm tightness/soreness in bilat shoulders and treatment options.  Discussed with pt non-medicinal options for pain management including gentle ROM, kinesiotaping, heat, etc.  Pt reports that heat does help reduce tension/pain.     Performed re-assessment of balance with BERG balance assessment; see below for details.  Provided pt with handout of OTAGO HEP for LE strengthening and level C challenges for balance.  Demonstrated each exercise to pt and had pt return demonstrate each.  At end of session pt ambulated back to room without AD mod I and pt left in w/c with family and RN present and hot pack placed on each upper trap.      Therapy Documentation Precautions:  Precautions Precautions: Fall, Cervical Required Braces or Orthoses: Cervical  Brace Cervical Brace: Hard collar, At all times Restrictions Weight Bearing Restrictions: Yes LUE Weight Bearing: Weight bear through elbow only Pain: Pain Assessment Pain Assessment: 0-10 Pain Score: 2  Pain Type: Acute pain Pain Location: Arm Pain Orientation: Left Pain Intervention(s): Medication (See eMAR) Balance: Standardized Balance Assessment Standardized Balance Assessment: Berg Balance Test Berg Balance Test Sit to Stand: Able to stand without using hands and stabilize independently Standing Unsupported: Able to stand safely 2 minutes Sitting with Back Unsupported but Feet Supported on Floor or Stool: Able to sit safely and securely 2 minutes Stand to Sit: Sits safely with minimal use of hands Transfers: Able to transfer safely, minor use of hands Standing Unsupported with Eyes Closed: Able to stand 10 seconds safely Standing Ubsupported with Feet Together: Able to place feet together independently and stand 1 minute safely From Standing, Reach Forward with Outstretched Arm: Can reach confidently >25 cm (10") From Standing Position, Pick up Object from Floor: Able to pick up shoe, needs supervision From Standing Position, Turn to Look Behind Over each Shoulder: Looks behind from both sides and weight shifts well (within available range of C spine in C collar) Turn 360 Degrees: Able to turn 360 degrees safely in 4 seconds or less Standing Unsupported, Alternately Place Feet on Step/Stool: Able to stand independently and safely and complete 8 steps in 20 seconds Standing Unsupported, One Foot in Front: Able to place foot tandem independently and hold 30 seconds Standing on One Leg: Able to lift leg independently and hold > 10 seconds Total Score: 55 Patient demonstrates  increased fall risk as noted by score of 55/56 on Berg Balance Scale.  (<36= high risk for falls, close to 100%; 37-45 significant >80%; 46-51 moderate >50%; 52-55 lower >25%)   See Function Navigator for  Current Functional Status.   Therapy/Group: Individual Therapy  Raylene Everts [facility] 07/29/2015, 4:02 PM

## 2015-07-29 NOTE — Progress Notes (Signed)
Occupational Therapy Session Note  Patient Details  Name: SKYYE GABA MRN: [id number] Date of Birth: 1975-10-09  Today's Date: 07/29/2015 OT Individual Time: 1100-1200 OT Individual Time Calculation (min): 60 min   Short Term Goals: Week 1:  OT Short Term Goal 1 (Week 1): STGs=LTGs secondary to estimated LOS  Skilled Therapeutic Interventions/Progress Updates: ADL-retraining with focus on discharge planning, functional mobility , safety awareness, adherence to precautions and problem-solving.   Pt received seated in w/c awaiting therapist.   Pt reports prior completing BADL prior to treatment with overall setup assist from RN tech.   Pt observed without her TEDs but she donned them when prompted with min instructional cues to not roll excess material (thigh high TEDs used).  Pt completed functional mobility without device in room and ambulated to kitchen for re-ed on adaptations needed s/p discharge.   Pt clarified that her daughter does and will cook for heras needed once she is able to eat solid foods again.  Pt able to perform light home making task in her room with only supervision for safety.   Pt does report difficulty laying down flat in bed and she was willing to attempt alternate methods to recover to supine, example: side lying, using bed ladder (constructed from linen), relying on core strength, however each attempt is limited by tension/discomfort which pt was unable to overcome.   Pt and OT then discussed options to laying flat to include reclining in chair or sofa or purchasing a recliner for sleep at home or renting a hospital bed.   OT advised pt of plan to discuss problem with bed mobility with physical therapists and PA.   Pt left in bed at end of session with HOB elevated and all needs within reach.     Therapy Documentation Precautions:  Precautions Precautions: Fall, Cervical Required Braces or Orthoses: Cervical Brace Cervical Brace: Hard collar, At all  times Restrictions Weight Bearing Restrictions: Yes LUE Weight Bearing: Weight bear through elbow only  Pain: Pain Assessment Pain Assessment: 0-10 Pain Score: 1  Pain Location: Neck Pain Descriptors / Indicators: Discomfort Pain Intervention(s): Repositioned;Ambulation/increased activity  See Function Navigator for Current Functional Status.   Therapy/Group: Individual Therapy  [place] 07/29/2015, 12:43 PM
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
## 2015-07-29 NOTE — Progress Notes (Signed)
Physical Therapy Session Note  Patient Details  Name: [name] MRN: [id number] Date of Birth: [birth year]  Today's Date: 07/29/2015 PT Individual Time: 0900-0945 PT Individual Time Calculation (min): 45 min   Short Term Goals: Week 1:  PT Short Term Goal 1 (Week 1): STG=LTG, Mod I overall  Skilled Therapeutic Interventions/Progress Updates:    Pt received in recliner & agreeable to PT. Pt reported 1/10 neck discomfort but notes already receiving pain medication. PT educated pt that she may continue to feel discomfort for awhile & PT very minimally adjusted cervical brace as it had shifted from midline. Pt ambulated to bathroom & completed toileting tasks with Mod I. Pt ambulated off of unit down to gift shop without AD with mod I & minimal L arm swing. Pt able to recall neck precautions "I can't move it", & PT reviewed no bending, turning or moving neck. In gift shop PT reviewed with pt looking at objects on high & low shelves, educating pt to look with eyes & not turn head. Also reviewed retrieving objects from high/low shelves. Pt able to demonstrate a squat with supervision fade to Mod I to retreive object from low shelf. Also educated pt on reacher as a possibility for retrieving objects off of floor. Pt returned to unit & then rehab gym where Wii was utilized to work weight shifting & balance training. Pt able to shift weight well without moving head/neck. At end of session pt ambulated back to room & left sitting on EOB with all needs within reach. Pt reports she feels as though her balance has improved today compared to yesterday.   Therapy Documentation Precautions:  Precautions Precautions: Fall, Cervical Required Braces or Orthoses: Cervical Brace Cervical Brace: Hard collar, At all times Restrictions Weight Bearing Restrictions: Yes LUE Weight Bearing: Weight bear through elbow only Pain: Pain Assessment Pain Assessment: 0-10 Pain Score: 1  Pain Location: Neck Pain  Orientation: Left Pain Descriptors / Indicators: Discomfort Pain Intervention(s): Repositioned;Ambulation/increased activity   See Function Navigator for Current Functional Status.   Therapy/Group: Individual Therapy  Waunita Schooner 07/29/2015, 9:47 AM
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
## 2015-07-30 ENCOUNTER — Inpatient Hospital Stay (HOSPITAL_COMMUNITY): Payer: BLUE CROSS/BLUE SHIELD | Admitting: Physical Therapy

## 2015-07-30 ENCOUNTER — Inpatient Hospital Stay (HOSPITAL_COMMUNITY): Payer: BLUE CROSS/BLUE SHIELD

## 2015-07-30 ENCOUNTER — Inpatient Hospital Stay (HOSPITAL_COMMUNITY): Payer: BLUE CROSS/BLUE SHIELD | Admitting: Speech Pathology

## 2015-07-30 DIAGNOSIS — E876 Hypokalemia: Secondary | ICD-10-CM

## 2015-07-30 LAB — BASIC METABOLIC PANEL
Anion gap: 12 (ref 5–15)
BUN: 6 mg/dL (ref 6–20)
CALCIUM: 8.7 mg/dL — AB (ref 8.9–10.3)
CO2: 23 mmol/L (ref 22–32)
CREATININE: 1.07 mg/dL — AB (ref 0.44–1.00)
Chloride: 99 mmol/L — ABNORMAL LOW (ref 101–111)
GFR calc non Af Amer: >60 mL/min (ref >60)
Glucose, Bld: 111 mg/dL — ABNORMAL HIGH (ref 65–99)
Potassium: 3.4 mmol/L — ABNORMAL LOW (ref 3.5–5.1)
SODIUM: 134 mmol/L — AB (ref 135–145)

## 2015-07-30 LAB — CBC WITH DIFFERENTIAL/PLATELET
BASOS PCT: 0 %
Basophils Absolute: 0 10*3/uL (ref 0.0–0.1)
EOS ABS: 0.3 10*3/uL (ref 0.0–0.7)
Eosinophils Relative: 3 %
HEMATOCRIT: 27.6 % — AB (ref 36.0–46.0)
HEMOGLOBIN: 8.5 g/dL — AB (ref 12.0–15.0)
Lymphocytes Relative: 29 %
Lymphs Abs: 2.3 10*3/uL (ref 0.7–4.0)
MCH: 24.8 pg — ABNORMAL LOW (ref 26.0–34.0)
MCHC: 30.8 g/dL (ref 30.0–36.0)
MCV: 80.5 fL (ref 78.0–100.0)
MONOS PCT: 10 %
Monocytes Absolute: 0.8 10*3/uL (ref 0.1–1.0)
NEUTROS PCT: 58 %
Neutro Abs: 4.7 10*3/uL (ref 1.7–7.7)
Platelets: 535 10*3/uL — ABNORMAL HIGH (ref 150–400)
RBC: 3.43 MIL/uL — ABNORMAL LOW (ref 3.87–5.11)
RDW: 15.6 % — AB (ref 11.5–15.5)
WBC: 8 10*3/uL (ref 4.0–10.5)

## 2015-07-30 MED ORDER — POTASSIUM CHLORIDE CRYS ER 20 MEQ PO TBCR
40.0000 meq | EXTENDED_RELEASE_TABLET | Freq: Once | ORAL | Status: AC
  Administered 2015-07-30: 40 meq via ORAL
  Filled 2015-07-30: qty 2
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
## 2015-07-30 NOTE — Progress Notes (Addendum)
Physical Therapy Discharge Summary  Patient Details  Name: [name] MRN: [id number] Date of Birth: 29-Apr-1975  Today's Date: 07/30/2015 PT Individual Time: 6761-9509 PT Individual Time Calculation (min): 85 min    Patient has met 8 of 8 long term goals due to improved activity tolerance, improved balance, improved postural control, increased strength and decreased pain.  Patient to discharge at an ambulatory level Modified Independent.   Patient's care partner is independent to provide the necessary assistance to patient with cervical collar (per OT) at discharge.  Reasons goals not met: N/A   Recommendation:  Patient will benefit from ongoing skilled PT services in vestibular outpatient once cervical precautions have been cleared & neck has healed to assess for any residual vestibular deficits.  Equipment: No equipment provided  Reasons for discharge: treatment goals met  Patient/family agrees with progress made and goals achieved: Yes  Skilled PT treatment: Pt received in bed, denying c/o pain, & agreeable to PT. Pt transferred out of bed (HOB elevated) & ambulated throughout room to gather different clothing items. Pt able to doff/don clothing with Mod I while standing & sitting at EOB. During session pt able to complete all transfers (toilet, car, sit<>stand) with Mod I and ambulate without AD with Mod I. Pt negotiated 4 steps with L ascending rail with PT educating pt she could hold on to rail with LUE for balance only but could not bear weight through wrist. Pt able to negotiate 8 steps without railings & mod I. Pt then picked up object from floor by squatting, with Mod I. PT educated pt that she could flex at hips to bend over to retrieve object but that she could not look down by flexing her neck forward. Pt also practiced retrieving multiple objects from floor by using reacher, as pt reports she plans to purchase one. Pt voiced comfort with picking up objects with reacher  after practicing. Challenged pt's balance by having her stand on compliant surface x 10 minutes while playing cognitive game. Utilized nu-step for BLE strengthening & endurance training & pt able to complete Level 2x 2 minutes + Level 6 x 10 minutes + Level 2 x 2 minutes with BLE only & intermittent rest breaks as needed. Pt played Wii balance games to focus on weight shifting & balance training. Pt reported need to use bathroom & pt ambulated back to room & completed toileting tasks with Mod I. Pt reports she does not plan to sleep in bed at home as this hurts her neck; pt plans to buy & sleep in recliner. Pt voiced that she feels comfortable with returning at home. At end of session pt left sitting on EOB with all needs within reach.   PT Discharge Precautions/Restrictions Precautions Precautions: Cervical Precaution Comments: aspen collar Cervical Brace: Hard collar Restrictions Weight Bearing Restrictions: Yes LUE Weight Bearing: Weight bear through elbow only  Pain Pain Assessment Pain Assessment: No/denies pain (at beginning of session) Pain Score: 0-No pain Pain Location: Neck Pain Descriptors / Indicators: Aching Pain Intervention(s): Medication (See eMAR) Vision/Perception    Pt c/o blurry vision (altered from baseline PTA).  Cognition Overall Cognitive Status: Within Functional Limits for tasks assessed Orientation Level: Oriented X4 Memory: Appears intact Awareness: Appears intact Safety/Judgment: Appears intact Sensation Sensation Light Touch: Appears Intact (BLE)  Mobility Bed Mobility Bed Mobility: Supine to Sit;Sit to Supine Supine to Sit: 6: Modified independent (Device/Increase time) Sit to Supine: 6: Modified independent (Device/Increase time) Transfers Transfers: Yes Sit to Stand: 6: Modified  independent (Device/Increase time) Locomotion  Ambulation Ambulation: Yes Ambulation/Gait Assistance: 6: Modified independent (Device/Increase time) Ambulation  Distance (Feet): 200 Feet Assistive device: None Stairs / Additional Locomotion Stairs: Yes Stairs Assistance: 6: Modified independent (Device/Increase time) Stair Management Technique: No rails;One rail Right Ramp: 6: Modified independent (Device)  Extremity Assessment      RLE Assessment RLE Assessment: Within Functional Limits (grossly 4 to 4+/5) LLE Assessment LLE Assessment: Within Functional Limits (grossly 4 to 4+/5)   See Function Navigator for Current Functional Status.  Waunita Schooner 07/30/2015, 5:50 PM
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
## 2015-07-30 NOTE — Progress Notes (Signed)
Social Work Patient ID: [name], female   DOB: [birth year], 40 y.o.   MRN: [id number] [name]-PT and [name]-PT feel OPPT too soon for pt needs to wait until her neck is more healed and able to come out of the neck brace, then will need vestibular rehab. Pt is not considered homebound and is ambulating too far for home health services also need to preserve her visits due to insurance limited. Parents to get tub bench on their own. Preparing pt for discharge tomorrow.

## 2015-07-30 NOTE — Plan of Care (Signed)
Problem: RH Stairs Goal: LTG Patient will ambulate up and down stairs w/assist (PT) LTG: Patient will ambulate up and down # of stairs with assistance (PT)  Outcome: Completed/Met Date Met:  07/30/15 5 steps with L rail, 8 steps without rail

## 2015-07-30 NOTE — Progress Notes (Signed)
Speech Language Pathology Discharge Summary  Patient Details  Name: [name] MRN: [id number] Date of Birth: Dec 24, 1975  Today's Date: 07/30/2015 SLP Individual Time: 1100-1155 SLP Individual Time Calculation (min): 55 min   Skilled Therapeutic Interventions:  Pt was seen for skilled ST targeting cognitive and dysphagia goals.  SLP facilitated the session with a semi-complex medication and financial management tasks from ALFA standardized cognitive assessment to address higher level problem solving and measure progress from initial evaluation.  Pt was 100% accurate with mod I for counting money and making change as well as for writing checks and balancing a check book.  Pt was 80% accurate with mod I for completing functional math calculations from word problems.   Pt was also 100% accurate with mod I for organizing pills of varying frequencies into a pill chart.  Pt consumed limited amounts of presentations of her currently prescribed diet with mod I use of standard safe swallowing precautions.  Pt reported good toleration of advanced textures since diet upgrade yesterday.  Pt provided with a handout of dys 3 textures to assist in meal planning while pt's mastication is still limited.  All questions were answered to pt's satisfaction at this time.  Pt left in room in recliner with call bell left within reach.     Patient has met 3 of 3 long term goals.  Patient to discharge at overall Modified Independent level.  Reasons goals not met:     Clinical Impression/Discharge Summary:  Pt made functional gains while inpatient and is discharging having met 3 out of 3 long term goals.  Pt currently is mod I for semi-complex cognitive tasks and reports being near her baseline.  Pt's diet has also been upgraded to dys 3 textures and thin liquids, which pt is consuming with mod I use of standard swallowing precautions.  Pt is discharging home with assistance from family.  Pt education is complete at  this time.  No further ST needs are indicated.    Care Partner:  Caregiver Able to Provide Assistance: Yes  Type of Caregiver Assistance: Physical  Recommendation:  None      Equipment: none recommended by SLP    Reasons for discharge: Discharged from hospital   Patient/Family Agrees with Progress Made and Goals Achieved: Yes   Function:  Eating Eating   Modified Consistency Diet: Yes Eating Assist Level: No help, No cues           Cognition Comprehension Comprehension assist level: Follows complex conversation/direction with no assist  Expression   Expression assist level: Expresses complex ideas: With no assist  Social Interaction Social Interaction assist level: Interacts appropriately with others - No medications needed.  Problem Solving Problem solving assist level: Solves complex problems: With extra time  Memory Memory assist level: Complete Independence: No helper   Emilio Math 07/30/2015, 3:52 PM

## 2015-07-30 NOTE — Discharge Instructions (Signed)
Inpatient Rehab Discharge Instructions  [place] Discharge date and time: No discharge date for patient encounter.   Activities/Precautions/ Functional Status: Activity: Cervical collar at all times Diet: As directed Wound Care: keep wound clean and dry Functional status:  ___ No restrictions     ___ Walk up steps independently ___ 24/7 supervision/assistance   ___ Walk up steps with assistance ___ Intermittent supervision/assistance  ___ Bathe/dress independently ___ Walk with walker     _x__ Bathe/dress with assistance ___ Walk Independently    ___ Shower independently ___ Walk with assistance    ___ Shower with assistance ___ No alcohol     ___ Return to work/school ________  Special Instructions:    COMMUNITY REFERRALS UPON DISCHARGE:   None:  WILL NEED OPPT VESTIBULAR ONCE NECK MORE HEALED AND COLLAR DISCHARGED PATIENT AND MOM AWARE OF THERAPISTS RECOMMENDATIONS   Medical Equipment/Items Ordered:NO NEEDS    My questions have been answered and I understand these instructions. I will adhere to these goals and the provided educational materials after my discharge from the hospital.  Patient/Caregiver Signature _______________________________ Date __________  Clinician Signature _______________________________________ Date __________  Please bring this form and your medication list with you to all your follow-up doctor's appointments.

## 2015-07-30 NOTE — Progress Notes (Signed)
Social Work Patient ID: [name], female   DOB: [birth year], 40 y.o.   MRN: [id number] Team feels pt will be ready for discharge tomorrow and MD approves and reports medically stable. Pt and family agreeable and here for family training today.

## 2015-07-30 NOTE — Progress Notes (Signed)
Social Work  Discharge Note  The overall goal for the admission was met for:   Discharge location: [place]  Length of Stay: Yes-7 DAYS  Discharge activity level: Yes-MOD/I LEVEL  Home/community participation: Yes  Services provided included: MD, RD, PT, OT, SLP, RN, CM, TR, Pharmacy, Neuropsych and SW  Financial Services: Private Insurance: [employer]  Follow-up services arranged: Other: NO DME AND FOLLOW UP UNTIL HEALS MORE WITH NECK  Comments (or additional information):MOM TO STAY FOE A FEW WEEKS AND HAS BEEN TRAINED IN HER CARE AND COMFORTABLE WITH. PT TO DO OPPT ONCE NECK MORE HEALED AND COLLAR OFF. BOTH AGREEABLE TO THIS PLAN. FMLA PAPERS COMPLETED ON BOTH MOM AND PT.  Patient/Family verbalized understanding of follow-up arrangements: Yes  Individual responsible for coordination of the follow-up plan: SELF & SHERRY-MOM  Confirmed correct DME delivered: Elease Hashimoto 07/30/2015    Elease Hashimoto

## 2015-07-30 NOTE — Progress Notes (Signed)
Social Work Patient ID: [name], female   DOB: 03/14/1976, 40 y.o.   MRN: [id number] Faxed pt's FMLA forms into to insurance and have given originals back to pt. Mom here all day and was trained in her care, ready for discharge tomorrow.
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
## 2015-07-30 NOTE — Progress Notes (Signed)
Occupational Therapy Session Note  Patient Details  Name: [name] MRN: [id number] Date of Birth: [birth year]  Today's Date: 07/30/2015 OT Individual [phone] 96 Min  Short Term Goals: Week 1:  OT Short Term Goal 1 (Week 1): STGs=LTGs secondary to estimated LOS  Skilled Therapeutic Interventions/Progress Updates: ADL-retraining with focus on family education with mother, father and pt's daughter present.   After family education on risk of severe injury relating to falls, pt's mother is directed to assist pt with self-care using close supervision during bathtub transfer with bath bench and setup to limit risk of fall.   Pt then gathers her clothing with her mother following her and she ambulates to tub room to perform tub bench transfer and bathe on bath bench.   Pt completes all tasks with mother providing min vc and standby assist for safety.   Pt dresses in bathroom with mother providing setup.   Pt returns to her room for family ed on how to replace Vista cervical collar pads while maintaining strict movement precautions.   Vista collar patient handbook reprinted and placed in pt's handbook for reference.   Pt demo's no LOB or confusion during process.   Pt's daughter is educated on need to assist with iADL, specifically meal prep during pt's recovery.   Father observed replacement of pads and declared that he could assist as well, if needed, at end of session.     Therapy Documentation Precautions:  Precautions Precautions: Fall, Cervical Required Braces or Orthoses: Cervical Brace Cervical Brace: Hard collar, At all times Restrictions Weight Bearing Restrictions: Yes LUE Weight Bearing: Weight bearing as tolerated  Vital Signs: Therapy Vitals Temp: 99.6 F (37.6 C) Temp Source: Oral Pulse Rate: 86 Resp: 18 BP: 126/86 mmHg Patient Position (if appropriate): Sitting Oxygen Therapy SpO2: 98 % O2 Device: Not Delivered   Pain: Pain Assessment Pain Score: 2   Pain Location: Neck Pain Descriptors / Indicators: Aching Pain Intervention(s): Medication (See eMAR)    See Function Navigator for Current Functional Status.   Therapy/Group: Individual Therapy  [place] 07/30/2015, 3:07 PM

## 2015-07-30 NOTE — Discharge Summary (Signed)
Discharge summary job [id number]

## 2015-07-31 DIAGNOSIS — F4323 Adjustment disorder with mixed anxiety and depressed mood: Secondary | ICD-10-CM

## 2015-07-31 MED ORDER — PROPRANOLOL HCL 10 MG PO TABS: 5.0000 mg | ORAL_TABLET | Freq: Three times a day (TID) | ORAL | Status: DC

## 2015-07-31 MED ORDER — OXYCODONE HCL 5 MG PO TABS: 5.0000 mg | ORAL_TABLET | ORAL | Status: DC | PRN

## 2015-07-31 MED ORDER — ALPRAZOLAM 0.5 MG PO TABS: 0.5000 mg | ORAL_TABLET | Freq: Two times a day (BID) | ORAL | Status: DC | PRN

## 2015-07-31 MED ORDER — SERTRALINE HCL 50 MG PO TABS
50.0000 mg | ORAL_TABLET | Freq: Every day | ORAL | Status: DC
Start: 2015-07-31 — End: 2015-09-19

## 2015-07-31 MED ORDER — POLYVINYL ALCOHOL 1.4 % OP SOLN: 2.0000 [drp] | OPHTHALMIC | Status: DC | PRN
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
## 2015-07-31 NOTE — Progress Notes (Signed)
Patient received discharge instructions from Marlowe [place], PA-C with verbal understanding. Patient discharged to home with family and belongings.

## 2015-07-31 NOTE — Progress Notes (Signed)
Removed staples in left arm and sutures in tongue per order from [employer], PA-C. Patient tolerated procedure well.
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
## 2015-08-06 ENCOUNTER — Ambulatory Visit (INDEPENDENT_AMBULATORY_CARE_PROVIDER_SITE_OTHER): Payer: BLUE CROSS/BLUE SHIELD | Admitting: Internal Medicine

## 2015-08-06 ENCOUNTER — Encounter: Payer: Self-pay | Admitting: Internal Medicine

## 2015-08-06 VITALS — BP 140/92 | HR 74 | Temp 98.2°F | Resp 16 | Ht 62.75 in

## 2015-08-06 DIAGNOSIS — S12090S Other displaced fracture of first cervical vertebra, sequela: Secondary | ICD-10-CM | POA: Diagnosis not present

## 2015-08-06 DIAGNOSIS — S41112D Laceration without foreign body of left upper arm, subsequent encounter: Secondary | ICD-10-CM | POA: Diagnosis not present

## 2015-08-06 DIAGNOSIS — K5900 Constipation, unspecified: Secondary | ICD-10-CM

## 2015-08-06 DIAGNOSIS — S025XXS Fracture of tooth (traumatic), sequela: Secondary | ICD-10-CM

## 2015-08-06 DIAGNOSIS — D62 Acute posthemorrhagic anemia: Secondary | ICD-10-CM

## 2015-08-06 DIAGNOSIS — S0181XD Laceration without foreign body of other part of head, subsequent encounter: Secondary | ICD-10-CM

## 2015-08-06 MED ORDER — MONTELUKAST SODIUM 10 MG PO TABS: 10.0000 mg | ORAL_TABLET | Freq: Every day | ORAL | Status: DC

## 2015-08-06 MED ORDER — CYCLOBENZAPRINE HCL 10 MG PO TABS: 10.0000 mg | ORAL_TABLET | Freq: Three times a day (TID) | ORAL | Status: DC | PRN

## 2015-08-06 MED ORDER — NALOXEGOL OXALATE 25 MG PO TABS: 25.0000 mg | ORAL_TABLET | Freq: Every day | ORAL | Status: DC

## 2015-08-06 NOTE — Patient Instructions (Signed)
Please take flexeril up to 3 times per day as needed for muscle soreness and muscle spasms.  Please do not drive when you take this medication.  If it makes you too sleepy you can cut it in half.  Take movantik once daily to help prevent constipation from the pain medication.  Take it with food.  Please take singulair to help with congestion and allergies.    Katrina will call you about seeing an Chief Financial Officer and also the Psychiatric nurse.   Please send me your FMLA forms so I can get them filled out for you.
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
## 2015-08-07 LAB — CBC WITH DIFFERENTIAL/PLATELET
BASOS PCT: 1 %
Basophils Absolute: 62 cells/uL (ref 0–200)
EOS PCT: 4 %
Eosinophils Absolute: 248 cells/uL (ref 15–500)
HCT: 31.1 % — ABNORMAL LOW (ref 35.0–45.0)
Hemoglobin: 9.4 g/dL — ABNORMAL LOW (ref 11.7–15.5)
Lymphocytes Relative: 37 %
Lymphs Abs: 2294 cells/uL (ref 850–3900)
MCH: 24.7 pg — AB (ref 27.0–33.0)
MCHC: 30.2 g/dL — ABNORMAL LOW (ref 32.0–36.0)
MCV: 81.6 fL (ref 80.0–100.0)
MONOS PCT: 7 %
MPV: 8.6 fL (ref 7.5–12.5)
Monocytes Absolute: 434 cells/uL (ref 200–950)
NEUTROS ABS: 3162 {cells}/uL (ref 1500–7800)
Neutrophils Relative %: 51 %
PLATELETS: 565 10*3/uL — AB (ref 140–400)
RBC: 3.81 MIL/uL (ref 3.80–5.10)
RDW: 15.7 % — ABNORMAL HIGH (ref 11.0–15.0)
WBC: 6.2 10*3/uL (ref 3.8–10.8)

## 2015-08-07 LAB — COMPREHENSIVE METABOLIC PANEL
ALT: 20 U/L (ref 6–29)
AST: 13 U/L (ref 10–30)
Albumin: 3.8 g/dL (ref 3.6–5.1)
Alkaline Phosphatase: 57 U/L (ref 33–115)
BUN: 9 mg/dL (ref 7–25)
CHLORIDE: 105 mmol/L (ref 98–110)
CO2: 22 mmol/L (ref 20–31)
CREATININE: 0.95 mg/dL (ref 0.50–1.10)
Calcium: 9.8 mg/dL (ref 8.6–10.2)
Glucose, Bld: 98 mg/dL (ref 65–99)
Potassium: 4 mmol/L (ref 3.5–5.3)
SODIUM: 140 mmol/L (ref 135–146)
TOTAL PROTEIN: 7.4 g/dL (ref 6.1–8.1)
Total Bilirubin: 0.5 mg/dL (ref 0.2–1.2)

## 2015-08-12 ENCOUNTER — Ambulatory Visit: Payer: No Typology Code available for payment source | Admitting: Physical Therapy

## 2015-08-26 ENCOUNTER — Other Ambulatory Visit: Payer: Self-pay | Admitting: Internal Medicine

## 2015-08-26 ENCOUNTER — Telehealth: Payer: Self-pay | Admitting: Internal Medicine

## 2015-08-26 DIAGNOSIS — S12000D Unspecified displaced fracture of first cervical vertebra, subsequent encounter for fracture with routine healing: Secondary | ICD-10-CM

## 2015-08-26 DIAGNOSIS — S032XXA Dislocation of tooth, initial encounter: Secondary | ICD-10-CM

## 2015-09-04 ENCOUNTER — Other Ambulatory Visit: Payer: Self-pay | Admitting: Internal Medicine

## 2015-09-18 ENCOUNTER — Other Ambulatory Visit: Payer: Self-pay | Admitting: Neurosurgery

## 2015-09-18 DIAGNOSIS — S12001A Unspecified nondisplaced fracture of first cervical vertebra, initial encounter for closed fracture: Secondary | ICD-10-CM

## 2015-09-19 ENCOUNTER — Ambulatory Visit (INDEPENDENT_AMBULATORY_CARE_PROVIDER_SITE_OTHER): Payer: BLUE CROSS/BLUE SHIELD | Admitting: Internal Medicine

## 2015-09-19 ENCOUNTER — Encounter: Payer: Self-pay | Admitting: Internal Medicine

## 2015-09-19 VITALS — BP 162/98 | HR 80 | Temp 98.2°F | Resp 16 | Ht 62.75 in | Wt 164.0 lb

## 2015-09-19 DIAGNOSIS — S025XXS Fracture of tooth (traumatic), sequela: Secondary | ICD-10-CM | POA: Diagnosis not present

## 2015-09-19 DIAGNOSIS — S41112D Laceration without foreign body of left upper arm, subsequent encounter: Secondary | ICD-10-CM | POA: Diagnosis not present

## 2015-09-19 DIAGNOSIS — S12090S Other displaced fracture of first cervical vertebra, sequela: Secondary | ICD-10-CM | POA: Diagnosis not present

## 2015-09-19 DIAGNOSIS — S0292XD Unspecified fracture of facial bones, subsequent encounter for fracture with routine healing: Secondary | ICD-10-CM | POA: Diagnosis not present

## 2015-09-19 DIAGNOSIS — F4310 Post-traumatic stress disorder, unspecified: Secondary | ICD-10-CM

## 2015-09-19 DIAGNOSIS — S0181XD Laceration without foreign body of other part of head, subsequent encounter: Secondary | ICD-10-CM | POA: Diagnosis not present

## 2015-09-19 MED ORDER — CYCLOBENZAPRINE HCL 10 MG PO TABS: 10.0000 mg | ORAL_TABLET | Freq: Three times a day (TID) | ORAL | Status: AC | PRN

## 2015-09-19 MED ORDER — PROPRANOLOL HCL 10 MG PO TABS: 5.0000 mg | ORAL_TABLET | Freq: Three times a day (TID) | ORAL | Status: DC

## 2015-09-19 MED ORDER — SERTRALINE HCL 50 MG PO TABS: 50.0000 mg | ORAL_TABLET | Freq: Every day | ORAL | Status: DC

## 2015-09-19 MED ORDER — ALPRAZOLAM 0.5 MG PO TABS: 0.5000 mg | ORAL_TABLET | Freq: Two times a day (BID) | ORAL | Status: AC | PRN
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
## 2015-09-20 ENCOUNTER — Other Ambulatory Visit: Payer: Self-pay | Admitting: *Deleted

## 2015-09-20 MED ORDER — PROPRANOLOL HCL 10 MG PO TABS: 5.0000 mg | ORAL_TABLET | Freq: Three times a day (TID) | ORAL | Status: DC

## 2015-09-23 ENCOUNTER — Ambulatory Visit
Admission: RE | Admit: 2015-09-23 | Discharge: 2015-09-23 | Disposition: A | Discharge status: Home / Self Care | Payer: BLUE CROSS/BLUE SHIELD | Source: Ambulatory Visit | Attending: Neurosurgery | Admitting: Neurosurgery

## 2015-09-23 DIAGNOSIS — S12001A Unspecified nondisplaced fracture of first cervical vertebra, initial encounter for closed fracture: Secondary | ICD-10-CM

## 2015-10-29 ENCOUNTER — Ambulatory Visit (INDEPENDENT_AMBULATORY_CARE_PROVIDER_SITE_OTHER): Payer: BLUE CROSS/BLUE SHIELD | Admitting: Internal Medicine

## 2015-10-29 ENCOUNTER — Encounter: Payer: Self-pay | Admitting: Internal Medicine

## 2015-10-29 VITALS — BP 128/82 | HR 78 | Temp 98.4°F | Resp 16 | Ht 62.75 in

## 2015-10-29 DIAGNOSIS — L0211 Cutaneous abscess of neck: Secondary | ICD-10-CM | POA: Diagnosis not present

## 2015-10-29 MED ORDER — DOXYCYCLINE HYCLATE 100 MG PO CAPS: 100.0000 mg | ORAL_CAPSULE | Freq: Two times a day (BID) | ORAL | 0 refills | Status: DC

## 2015-10-29 NOTE — Progress Notes (Signed)
   Subjective:    Patient ID: [name], female    DOB: 05/11/1975, 40 y.o.   MRN: [id number]  HPI  Patient presents for a growth to the left side of her neck x 5 days.  She has been using warm compresses.  She has had an abscess at this location before.  She is still in a cervical collar as she did have a shift of her C1 fracture and is due to wera this for at least another month.  She has never had an I&D done.  No fever, chills, nausea, vomiting, or spreading redness.    Review of Systems  Constitutional: Negative for chills, fatigue and fever.  Gastrointestinal: Negative for nausea and vomiting.  Skin: Positive for wound. Negative for color change and rash.       Objective:   Physical Exam  Constitutional: She appears well-developed and well-nourished. No distress. Cervical collar in place.  HENT:  Head: Normocephalic.  Mouth/Throat: Oropharynx is clear and moist. No oropharyngeal exudate.  Eyes: Conjunctivae are normal. No scleral icterus.  Neck: No JVD present. Decreased range of motion present. No thyromegaly present.    Cardiovascular: Normal rate, regular rhythm, normal heart sounds and intact distal pulses.  Exam reveals no gallop and no friction rub.   No murmur heard. Pulmonary/Chest: Effort normal and breath sounds normal. No respiratory distress. She has no wheezes. She has no rales. She exhibits no tenderness.  Lymphadenopathy:    She has no cervical adenopathy.  Skin: Skin is warm and dry. She is not diaphoretic.  Psychiatric: She has a normal mood and affect. Her behavior is normal. Judgment and thought content normal.  Nursing note and vitals reviewed.   Vitals:   10/29/15 1554  BP: 128/82  Pulse: 78  Resp: 16  Temp: 98.4 F (36.9 C)         Assessment & Plan:    1. Abscess, neck -not enough fluctuance to do an I&D, and given neck brace and position near vital blood vessels hesitant to perform.   -no evidence of cellulitis at this  time -doxycycline 100 BID x 7 days -recheck on Friday -patient to go to ER if fever, chills, nausea, vomiting, or swelling of the neck

## 2015-10-29 NOTE — Telephone Encounter (Signed)
err

## 2015-11-01 ENCOUNTER — Ambulatory Visit (INDEPENDENT_AMBULATORY_CARE_PROVIDER_SITE_OTHER): Payer: BLUE CROSS/BLUE SHIELD | Admitting: Internal Medicine

## 2015-11-01 VITALS — BP 156/82 | HR 78 | Temp 98.2°F | Resp 18 | Ht 62.75 in

## 2015-11-01 DIAGNOSIS — I10 Essential (primary) hypertension: Secondary | ICD-10-CM

## 2015-11-01 DIAGNOSIS — L0211 Cutaneous abscess of neck: Secondary | ICD-10-CM | POA: Diagnosis not present

## 2015-11-01 MED ORDER — BISOPROLOL-HYDROCHLOROTHIAZIDE 2.5-6.25 MG PO TABS: 0.5000 | ORAL_TABLET | Freq: Two times a day (BID) | ORAL | 0 refills | Status: DC

## 2015-11-01 MED ORDER — CEPHALEXIN 500 MG PO CAPS: 500.0000 mg | ORAL_CAPSULE | Freq: Three times a day (TID) | ORAL | 0 refills | Status: AC

## 2015-11-01 NOTE — Patient Instructions (Signed)
Please take the keflex along with the doxycycline until you see me back on Tuesday.   Please take 1/2 tablet of the ziac in the morning and a half tablet in the evening.  Please stop the propranolol.

## 2015-11-01 NOTE — Progress Notes (Signed)
   Subjective:    Patient ID: [name], female    DOB: 1975/08/21, 40 y.o.   MRN: [id number]  HPI  Patient presents for a growth to the left side of her neck x 9 days.  She has been using warm compresses.  She has been taking the doxycycline. She reports that it is somewhat smaller.   She has had an abscess at this location before.  She is still in a cervical collar as she did have a shift of her C1 fracture and is due to wear this for at least another month.  She did recently get a second opinion who recommended that she remove the collar.  She has never had an I&D done.  No fever, chills, nausea, vomiting, or spreading redness.    Review of Systems  Constitutional: Negative for chills, fatigue and fever.  Gastrointestinal: Negative for nausea and vomiting.  Skin: Positive for wound. Negative for color change and rash.       Objective:   Physical Exam  Constitutional: She appears well-developed and well-nourished. No distress. Cervical collar in place.  HENT:  Head: Normocephalic.  Mouth/Throat: Oropharynx is clear and moist. No oropharyngeal exudate.  Eyes: Conjunctivae are normal. No scleral icterus.  Neck: No JVD present. Decreased range of motion present. No thyromegaly present.    Cardiovascular: Normal rate, regular rhythm, normal heart sounds and intact distal pulses.  Exam reveals no gallop and no friction rub.   No murmur heard. Pulmonary/Chest: Effort normal and breath sounds normal. No respiratory distress. She has no wheezes. She has no rales. She exhibits no tenderness.  Lymphadenopathy:    She has no cervical adenopathy.  Skin: Skin is warm and dry. She is not diaphoretic.  Psychiatric: She has a normal mood and affect. Her behavior is normal. Judgment and thought content normal.  Nursing note and vitals reviewed.   Vitals:   11/01/15 1122  BP: (!) 156/82  Pulse: 78  Resp: 18  Temp: 98.2 F (36.8 C)         Assessment & Plan:    1. Abscess,  neck -viewed with Dr. Melford Aase ho recommended keflex in addition to doxycycline.  Recheck on Tuesday  -no evidence of cellulitis at this time -doxycycline 100 BID x 7 days -recheck on Friday -patient to go to ER if fever, chills, nausea, vomiting, or swelling of the neck   2.  HTN -stop propranolol -ziac 2.5/6.25 1/2 tablet BID

## 2015-11-05 ENCOUNTER — Ambulatory Visit (INDEPENDENT_AMBULATORY_CARE_PROVIDER_SITE_OTHER): Payer: BLUE CROSS/BLUE SHIELD | Admitting: Internal Medicine

## 2015-11-05 ENCOUNTER — Encounter: Payer: Self-pay | Admitting: Internal Medicine

## 2015-11-05 VITALS — BP 122/90 | HR 99 | Temp 97.6°F | Resp 16 | Ht 62.75 in | Wt 167.6 lb

## 2015-11-05 DIAGNOSIS — L0211 Cutaneous abscess of neck: Secondary | ICD-10-CM | POA: Diagnosis not present

## 2015-11-05 MED ORDER — TRAMADOL HCL 50 MG PO TABS: 100.0000 mg | ORAL_TABLET | Freq: Four times a day (QID) | ORAL | 0 refills | Status: DC | PRN

## 2015-11-05 NOTE — Progress Notes (Signed)
   Subjective:    Patient ID: [name], female    DOB: 1975/06/21, 40 y.o.   MRN: [id number]  HPI  Patient presents for a growth to the left side of her neck x 12 days.  She has been using warm compresses.  She has been taking the doxycycline. She reports that it is somewhat smaller.   She has had an abscess at this location before.   She has never had an I&D done.  No fever, chills, nausea, vomiting, or spreading redness.    Review of Systems  Constitutional: Negative for chills, fatigue and fever.  Gastrointestinal: Negative for nausea and vomiting.  Skin: Positive for wound. Negative for color change and rash.       Objective:   Physical Exam  Constitutional: She appears well-developed and well-nourished. No distress. Cervical collar in place.  HENT:  Head: Normocephalic.  Mouth/Throat: Oropharynx is clear and moist. No oropharyngeal exudate.  Eyes: Conjunctivae are normal. No scleral icterus.  Neck: No JVD present. Decreased range of motion present. No thyromegaly present.    Cardiovascular: Normal rate, regular rhythm, normal heart sounds and intact distal pulses.  Exam reveals no gallop and no friction rub.   No murmur heard. Pulmonary/Chest: Effort normal and breath sounds normal. No respiratory distress. She has no wheezes. She has no rales. She exhibits no tenderness.  Lymphadenopathy:    She has no cervical adenopathy.  Skin: Skin is warm and dry. She is not diaphoretic.  Psychiatric: She has a normal mood and affect. Her behavior is normal. Judgment and thought content normal.  Nursing note and vitals reviewed.   There were no vitals filed for this visit.  Procedure:  Patient was advised of risks and benefits of an I&D including spreading redness, infection, and scarring.  After verbal consent the patient was prepped and draped in a non-sterile manner.  1.5 cc of marcaine with epinephrine was injected in the abscess.  After adequate numbing a 0.5 cm linear  incision was made with purulent drainge and cyst wall debris.  Loculations were broken by using a hemostat.  1/4" iodoform packing was placed and wound was covered with 4x4 guaze.  Patient tolerated procedure well without incident.       Assessment & Plan:    1. Abscess, neck -I&D performed here with adequate drainage of purulent material. -packing in place -recheck tomorrow morning and remove packing -ultram prn for pain -finish doxycycline and keflex -go to ER for spreading redness, fever, nausea, or vomiting.  Patient stated understanding.

## 2015-11-06 ENCOUNTER — Ambulatory Visit (INDEPENDENT_AMBULATORY_CARE_PROVIDER_SITE_OTHER): Payer: BLUE CROSS/BLUE SHIELD | Admitting: Internal Medicine

## 2015-11-06 DIAGNOSIS — L0211 Cutaneous abscess of neck: Secondary | ICD-10-CM | POA: Diagnosis not present

## 2015-11-06 NOTE — Progress Notes (Signed)
   Subjective:    Patient ID: [name], female    DOB: 1975-11-12, 40 y.o.   MRN: [id number]  HPI  Patient presents to the office for reevaluation of abscess on neck after I&D yesterday.  No redness, warmth, swelling, fever, nausea or vomiting.  She did not get her pain medication.  She has not changed any bandaging. She is not sure whether it has continued to drain.    Review of Systems  Constitutional: Negative for chills, fatigue and fever.  Gastrointestinal: Negative for nausea and vomiting.  Skin: Positive for wound.       Objective:   Physical Exam  Constitutional: She is oriented to person, place, and time. She appears well-developed and well-nourished. No distress.  HENT:  Head: Normocephalic.  Mouth/Throat: Oropharynx is clear and moist. No oropharyngeal exudate.  Eyes: Conjunctivae are normal. No scleral icterus.  Neck:    Patient remains in a C collar  Cardiovascular: Normal rate, regular rhythm, normal heart sounds and intact distal pulses.   Pulmonary/Chest: Effort normal and breath sounds normal.  Musculoskeletal: Normal range of motion.  Neurological: She is alert and oriented to person, place, and time.  Skin: Skin is warm and dry. She is not diaphoretic.  Psychiatric: She has a normal mood and affect. Her behavior is normal. Judgment and thought content normal.  Nursing note and vitals reviewed.         Assessment & Plan:    1. Neck abscess -cont abx -will see back on Friday -continues to drain.  Will leave packing in until tomorrow.   If patient uncomfortable removing will remove first thing Friday morning.

## 2015-11-08 ENCOUNTER — Ambulatory Visit (INDEPENDENT_AMBULATORY_CARE_PROVIDER_SITE_OTHER): Payer: BLUE CROSS/BLUE SHIELD | Admitting: Internal Medicine

## 2015-11-08 ENCOUNTER — Encounter: Payer: Self-pay | Admitting: Internal Medicine

## 2015-11-08 VITALS — BP 118/88 | HR 67 | Temp 97.6°F | Resp 16 | Ht 62.75 in | Wt 167.0 lb

## 2015-11-08 DIAGNOSIS — L0211 Cutaneous abscess of neck: Secondary | ICD-10-CM | POA: Diagnosis not present

## 2015-11-08 NOTE — Progress Notes (Signed)
Patient presents for wound recheck of neck abcess which has been there approximately 2 weeks.  I&D was performed on Monday of this week with good drainage.  Packing came out yesterday with bandage change.  It continues to drain.  Doxycycline has been finished.  She has several more doses of keflex left.  No fevers, chills, nausea, vomiting, or spreading redness.  Wound on neck is actively draining serous fluids with no redness or warmth.  Still mildly indurated around the edges of the wounds without redness or warmth.  NO purulent drainage expressed.    Patient to finish keflex.  No further wound checks required unless issues.

## 2015-12-17 ENCOUNTER — Other Ambulatory Visit: Payer: Self-pay | Admitting: Internal Medicine

## 2016-01-20 ENCOUNTER — Other Ambulatory Visit: Payer: Self-pay | Admitting: Internal Medicine

## 2016-01-20 ENCOUNTER — Other Ambulatory Visit: Payer: Self-pay | Admitting: *Deleted

## 2016-01-20 MED ORDER — BISOPROLOL-HYDROCHLOROTHIAZIDE 2.5-6.25 MG PO TABS: 0.5000 | ORAL_TABLET | Freq: Two times a day (BID) | ORAL | 0 refills | Status: DC

## 2016-01-27 ENCOUNTER — Other Ambulatory Visit: Payer: Self-pay | Admitting: Neurosurgery

## 2016-01-27 DIAGNOSIS — S12001A Unspecified nondisplaced fracture of first cervical vertebra, initial encounter for closed fracture: Secondary | ICD-10-CM

## 2016-03-10 ENCOUNTER — Other Ambulatory Visit: Payer: BLUE CROSS/BLUE SHIELD

## 2016-04-13 ENCOUNTER — Ambulatory Visit
Admission: RE | Admit: 2016-04-13 | Discharge: 2016-04-13 | Disposition: A | Discharge status: Home / Self Care | Payer: BLUE CROSS/BLUE SHIELD | Source: Ambulatory Visit | Attending: Neurosurgery | Admitting: Neurosurgery

## 2016-04-13 DIAGNOSIS — S12001A Unspecified nondisplaced fracture of first cervical vertebra, initial encounter for closed fracture: Secondary | ICD-10-CM

## 2016-04-28 ENCOUNTER — Encounter: Payer: Self-pay | Admitting: Internal Medicine

## 2016-04-28 ENCOUNTER — Ambulatory Visit (INDEPENDENT_AMBULATORY_CARE_PROVIDER_SITE_OTHER): Payer: BLUE CROSS/BLUE SHIELD | Admitting: Internal Medicine

## 2016-04-28 VITALS — BP 124/76 | HR 68 | Temp 98.2°F | Resp 18 | Ht 62.75 in | Wt 188.0 lb

## 2016-04-28 DIAGNOSIS — L02219 Cutaneous abscess of trunk, unspecified: Secondary | ICD-10-CM

## 2016-04-28 DIAGNOSIS — L03319 Cellulitis of trunk, unspecified: Secondary | ICD-10-CM

## 2016-04-28 MED ORDER — DOXYCYCLINE HYCLATE 100 MG PO CAPS: 100.0000 mg | ORAL_CAPSULE | Freq: Two times a day (BID) | ORAL | 0 refills | Status: DC

## 2016-04-28 NOTE — Progress Notes (Signed)
   Subjective:    Patient ID: [name], female    DOB: 1975/05/19, 41 y.o.   MRN: [id number]  HPI  Patient reports that she has been having some active drainage coming from a cyst on her neck.  She reports that she has been having some pus draining from it.  She is not having pain coming from it.  She is using warm compresses on it.  She reports no fevers, chills, nausea vomiting.    Review of Systems  Constitutional: Negative for chills, fatigue and fever.  Respiratory: Negative for chest tightness and shortness of breath.   Cardiovascular: Negative for chest pain and palpitations.  Gastrointestinal: Negative for nausea and vomiting.  Skin: Positive for wound.       Objective:   Physical Exam  Constitutional: She appears well-developed and well-nourished. No distress.  HENT:  Head: Normocephalic.  Mouth/Throat: Oropharynx is clear and moist. No oropharyngeal exudate.  Eyes: Conjunctivae are normal. No scleral icterus.  Neck: Normal range of motion. Neck supple. No JVD present. No thyromegaly present.  Pulmonary/Chest: Effort normal and breath sounds normal. No respiratory distress. She has no wheezes. She has no rales. She exhibits no tenderness.    Abdominal: Soft. Bowel sounds are normal.  Lymphadenopathy:    She has no cervical adenopathy.  Skin: She is not diaphoretic.  Nursing note and vitals reviewed.   Vitals:   04/28/16 1102  BP: 124/76  Pulse: 68  Resp: 18  Temp: 98.2 F (36.8 C)   .iIncision and Drainage Procedure Note  Pre-operative Diagnosis: abscess  Post-operative Diagnosis: same  Indications: fluctuant abscess with drianage  Anesthesia: 0.5% bupivacaine with epinephrine, 3 cc  Procedure Details  The procedure, risks and complications have been discussed in detail (including, but not limited to airway compromise, infection, bleeding) with the patient, and the patient has signed consent to the procedure.  The skin was semi-sterilely  prepped and draped over the affected area in the usual fashion. After adequate local anesthesia, I&D with a #11 blade was performed on the midline chest. Purulent drainage: present with cyst wall removal.  There was blunt dissection performed to the abscess.  No packing was placed.  Patient tolerated procedure well.  Wound was covered with 4x4 gauze and then covered with large bandage.   The patient was observed until stable.  Findings: Abscess  EBL: 0 cc's  Drains: none  Condition: Tolerated procedure well   Complications: none.        Assessment & Plan:    1. Cellulitis and abscess of trunk -I&D performed here in the office with removal of cyst wall.  Patient tolerated procedure well.  Given this is an abscess that has recurred will also treat with doxycycline.  Patient will continue to use warm water soaks and will try to keep clean and dry.  She is aware to call office if worsening redness, swelling, fevers, chills, nausea, or vomiting.  She will return as needed.

## 2016-05-04 ENCOUNTER — Other Ambulatory Visit: Payer: Self-pay | Admitting: Internal Medicine

## 2016-05-11 ENCOUNTER — Ambulatory Visit (INDEPENDENT_AMBULATORY_CARE_PROVIDER_SITE_OTHER): Payer: BLUE CROSS/BLUE SHIELD | Admitting: Internal Medicine

## 2016-05-11 ENCOUNTER — Encounter: Payer: Self-pay | Admitting: Internal Medicine

## 2016-05-11 VITALS — BP 124/82 | HR 64 | Temp 98.0°F | Resp 16 | Ht 62.75 in | Wt 190.0 lb

## 2016-05-11 DIAGNOSIS — Z0000 Encounter for general adult medical examination without abnormal findings: Secondary | ICD-10-CM | POA: Diagnosis not present

## 2016-05-11 DIAGNOSIS — E559 Vitamin D deficiency, unspecified: Secondary | ICD-10-CM

## 2016-05-11 DIAGNOSIS — Z13220 Encounter for screening for lipoid disorders: Secondary | ICD-10-CM

## 2016-05-11 DIAGNOSIS — Z79899 Other long term (current) drug therapy: Secondary | ICD-10-CM

## 2016-05-11 DIAGNOSIS — R739 Hyperglycemia, unspecified: Secondary | ICD-10-CM

## 2016-05-11 DIAGNOSIS — L309 Dermatitis, unspecified: Secondary | ICD-10-CM

## 2016-05-11 DIAGNOSIS — Z130 Encounter for screening for diseases of the blood and blood-forming organs and certain disorders involving the immune mechanism: Secondary | ICD-10-CM

## 2016-05-11 DIAGNOSIS — I10 Essential (primary) hypertension: Secondary | ICD-10-CM

## 2016-05-11 LAB — BASIC METABOLIC PANEL WITH GFR
BUN: 16 mg/dL (ref 7–25)
CHLORIDE: 103 mmol/L (ref 98–110)
CO2: 25 mmol/L (ref 20–31)
Calcium: 9.2 mg/dL (ref 8.6–10.2)
Creat: 0.99 mg/dL (ref 0.50–1.10)
GFR, EST NON AFRICAN AMERICAN: 72 mL/min (ref >=60)
GFR, Est African American: 82 mL/min (ref >=60)
GLUCOSE: 79 mg/dL (ref 65–99)
Potassium: 4 mmol/L (ref 3.5–5.3)
SODIUM: 136 mmol/L (ref 135–146)

## 2016-05-11 LAB — CBC WITH DIFFERENTIAL/PLATELET
BASOS ABS: 58 {cells}/uL (ref 0–200)
Basophils Relative: 1 %
EOS PCT: 2 %
Eosinophils Absolute: 116 cells/uL (ref 15–500)
HEMATOCRIT: 33 % — AB (ref 35.0–45.0)
HEMOGLOBIN: 10.4 g/dL — AB (ref 11.7–15.5)
LYMPHS ABS: 2726 {cells}/uL (ref 850–3900)
Lymphocytes Relative: 47 %
MCH: 26.4 pg — ABNORMAL LOW (ref 27.0–33.0)
MCHC: 31.5 g/dL — ABNORMAL LOW (ref 32.0–36.0)
MCV: 83.8 fL (ref 80.0–100.0)
MONO ABS: 406 {cells}/uL (ref 200–950)
MPV: 9.1 fL (ref 7.5–12.5)
Monocytes Relative: 7 %
NEUTROS ABS: 2494 {cells}/uL (ref 1500–7800)
Neutrophils Relative %: 43 %
Platelets: 431 10*3/uL — ABNORMAL HIGH (ref 140–400)
RBC: 3.94 MIL/uL (ref 3.80–5.10)
RDW: 15.2 % — ABNORMAL HIGH (ref 11.0–15.0)
WBC: 5.8 10*3/uL (ref 3.8–10.8)

## 2016-05-11 LAB — LIPID PANEL
CHOL/HDL RATIO: 3.6 ratio (ref <5.0)
CHOLESTEROL: 179 mg/dL (ref <200)
HDL: 50 mg/dL — ABNORMAL LOW (ref >50)
LDL CALC: 109 mg/dL — AB (ref <100)
Triglycerides: 102 mg/dL (ref <150)
VLDL: 20 mg/dL (ref <30)

## 2016-05-11 LAB — IRON AND TIBC
%SAT: 5 % — AB (ref 11–50)
IRON: 20 ug/dL — AB (ref 40–190)
TIBC: 366 ug/dL (ref 250–450)
UIBC: 346 ug/dL (ref 125–400)

## 2016-05-11 LAB — HEPATIC FUNCTION PANEL
ALK PHOS: 57 U/L (ref 33–115)
ALT: 16 U/L (ref 6–29)
AST: 17 U/L (ref 10–30)
Albumin: 3.9 g/dL (ref 3.6–5.1)
BILIRUBIN DIRECT: 0.1 mg/dL (ref <=0.2)
BILIRUBIN INDIRECT: 0.3 mg/dL (ref 0.2–1.2)
Total Bilirubin: 0.4 mg/dL (ref 0.2–1.2)
Total Protein: 7.2 g/dL (ref 6.1–8.1)

## 2016-05-11 LAB — TSH: TSH: 1.77 mIU/L

## 2016-05-11 LAB — MAGNESIUM: Magnesium: 1.7 mg/dL (ref 1.5–2.5)

## 2016-05-11 LAB — VITAMIN B12: Vitamin B-12: 426 pg/mL (ref 200–1100)

## 2016-05-11 MED ORDER — CLOTRIMAZOLE-BETAMETHASONE 1-0.05 % EX CREA: TOPICAL_CREAM | CUTANEOUS | 1 refills | Status: AC

## 2016-05-11 NOTE — Progress Notes (Signed)
Annual Screening Comprehensive Examination   This very nice 41 y.o.female presents for complete physical.  Patient has no major health issues.  Patient reports no complaints at this time.   Patient reports that she has been having issues with her weight recently.  She has been eating an omelet with sausage and bell peppers, she usually has water and juice with that meal.  She will drink any type of juice in the house.  She will usually have a boiled egg and a sausage dog for lunch.  Dinner is usually chicken, a veggie and either rice or macaroni and cheese.  She reports that she is not exercising as much as she would like. She tries to get something in 2-3 times per week.    She has tried some topical cortisone 10 on some dry spots on her skin.  She reports that the spots have been there several months now.  She has not tried any prescriptions on them.    Finally, patient has history of Vitamin D Deficiency and last vitamin D was  Lab Results  Component Value Date   VD25OH 19 (L) 05/09/2015  .  Currently on supplementation     Current Outpatient Prescriptions on File Prior to Visit  Medication Sig Dispense Refill  . ALPRAZolam (XANAX) 0.5 MG tablet Take 1 tablet (0.5 mg total) by mouth 2 (two) times daily as needed for anxiety or sleep. 30 tablet 0  . bisoprolol-hydrochlorothiazide (ZIAC) 2.5-6.25 MG tablet take 1/2 tablet by mouth twice a day 90 tablet 0  . cyclobenzaprine (FLEXERIL) 10 MG tablet Take 1 tablet (10 mg total) by mouth 3 (three) times daily as needed for muscle spasms. 90 tablet 1  . sertraline (ZOLOFT) 50 MG tablet Take 1 tablet (50 mg total) by mouth daily. 270 tablet 3   No current facility-administered medications on file prior to visit.     Allergies  Allergen Reactions  . Zyrtec [Cetirizine] Other (See Comments)    Jittery.    Past Medical History:  Diagnosis Date  . Allergic rhinitis   . Anemia   . Asthma   . Blood glucose elevated   . H. pylori  infection 2015   treated  . Hyperlipidemia   . Labile hypertension   . Labile hypertension   . Spastic colon   . Vitamin D deficiency     Immunization History  Administered Date(s) Administered  . Influenza Whole 01/26/2012  . PPD Test 04/30/2014  . Tdap 02/05/2009, 03/12/2011, 07/18/2015    Past Surgical History:  Procedure Laterality Date  . COLONOSCOPY W/ POLYPECTOMY  2014  . COLPOSCOPY    . I&D EXTREMITY Left 07/18/2015   Procedure: IRRIGATION AND DEBRIDEMENT LEFT ARM AND LACERATION CLOSURE;  Surgeon: Loel Lofty Dillingham, DO;  Location: [facility];  Service: Plastics;  Laterality: Left;  . LACERATION REPAIR N/A 07/18/2015   Procedure: REPAIR MULTIPLE COMPLEX FACIAL LACERATIONS, 23, 24 Tooth extraction, COMPLEX LACERATION TONGUE REPAIR ;  Surgeon: Loel Lofty Dillingham, DO;  Location: [facility];  Service: Plastics;  Laterality: N/A;    Family History  Problem Relation Age of Onset  . Hypertension Father   . Diabetes Father   . Cancer Father     prostate  . Stroke Maternal Grandmother   . Breast cancer Paternal Aunt   . Cancer Paternal Aunt     breast  . Hypertension Mother   . Arthritis Mother   . Stroke Mother   . Kidney disease Maternal Grandfather   .  Stroke Paternal Grandfather     Social History   Social History  . Marital status: Single    Spouse name: N/A  . Number of children: 1  . Years of education: N/A   Occupational History  .  [employer]   Social History Main Topics  . Smoking status: Never Smoker  . Smokeless tobacco: Never Used  . Alcohol use No  . Drug use: No  . Sexual activity: Yes    Birth control/ protection: Condom   Other Topics Concern  . Not on file   Social History Narrative   Daily caffeine    Review of Systems  Constitutional: Negative for chills, fever and malaise/fatigue.  HENT: Negative for congestion, ear pain and sore throat.   Eyes: Negative.   Respiratory: Negative for cough, shortness of breath and wheezing.    Cardiovascular: Negative for chest pain, palpitations and leg swelling.  Gastrointestinal: Negative for abdominal pain, blood in stool, constipation, diarrhea, heartburn and melena.  Genitourinary: Negative.   Skin: Negative.   Neurological: Negative for dizziness, sensory change, loss of consciousness and headaches.  Psychiatric/Behavioral: Negative for depression. The patient is not nervous/anxious and does not have insomnia.       Physical Exam  Resp 16   Ht 5' 2.75" (1.594 m)   Wt 190 lb (86.2 kg)   LMP 05/11/2016   BMI 33.93 kg/m   General Appearance: Well nourished and in no apparent distress. Eyes: PERRLA, EOMs, conjunctiva no swelling or erythema, normal fundi and vessels. Sinuses: No frontal/maxillary tenderness ENT/Mouth: EACs patent / TMs  nl. Nares clear without erythema, swelling, mucoid exudates. Oral hygiene is good. No erythema, swelling, or exudate. Tongue normal, non-obstructing. Tonsils not swollen or erythematous. Hearing normal.  Neck: Supple, thyroid normal. No bruits, nodes or JVD. Head slightly angled to the right Respiratory: Respiratory effort normal.  BS equal and clear bilateral without rales, rhonci, wheezing or stridor. Cardio: Heart sounds are normal with regular rate and rhythm and no murmurs, rubs or gallops. Peripheral pulses are normal and equal bilaterally without edema. No aortic or femoral bruits. Chest: symmetric with normal excursions and percussion. Breasts: Deferred to Gyn Abdomen: Flat, soft, with bowl sounds. Nontender, no guarding, rebound, hernias, masses, or organomegaly.  Lymphatics: Non tender without lymphadenopathy.  Genitourinary: Deferred to Gyn Musculoskeletal: Full ROM all peripheral extremities, joint stability, 5/5 strength, and normal gait. Skin: Warm and dry without rashes, lesions, cyanosis, clubbing or  ecchymosis. Well healing 0.5 mm incision to the central sternal area with no tenderness to palpation.  NO drainage,  erythema, or warmth.   Neuro: Cranial nerves intact, reflexes equal bilaterally. Normal muscle tone, no cerebellar symptoms. Sensation intact.  Pysch: Awake and oriented X 3, normal affect, Insight and Judgment appropriate.   Assessment and Plan    1. Routine general medical examination at a health care facility -due next year  2. Essential hypertension -cont medications -cont dash diet -exercise as tolerated - Urinalysis, Routine w reflex microscopic - Microalbumin / creatinine urine ratio - EKG 12-Lead - TSH  3. Blood glucose elevated  - Hemoglobin A1c - Insulin, random  4. Screening for hyperlipidemia  - Lipid panel  5. Vitamin D deficiency -cont Vit D - VITAMIN D 25 Hydroxy (Vit-D Deficiency, Fractures)  6. Medication management  - CBC with Differential/Platelet - BASIC METABOLIC PANEL WITH GFR - Hepatic function panel - Magnesium  7. Screening for deficiency anemia  - Iron and TIBC - Vitamin B12  8. Dermatitis -if  no relief will need to see dermatology - clotrimazole-betamethasone (LOTRISONE) cream; Apply to affected area 2 times daily  Dispense: 15 g; Refill: 1    Continue prudent diet as discussed, weight control, regular exercise, and medications. Routine screening labs and tests as requested with regular follow-up as recommended.  Over 40 minutes of exam, counseling, chart review and critical decision making was performed

## 2016-05-12 LAB — URINALYSIS, MICROSCOPIC ONLY
Bacteria, UA: NONE SEEN [HPF]
CRYSTALS: NONE SEEN [HPF]
Casts: NONE SEEN [LPF]
Squamous Epithelial / LPF: NONE SEEN [HPF] (ref <=5)
WBC UA: NONE SEEN WBC/HPF (ref <=5)
Yeast: NONE SEEN [HPF]

## 2016-05-12 LAB — HEMOGLOBIN A1C
Hgb A1c MFr Bld: 5.6 % (ref <5.7)
Mean Plasma Glucose: 114 mg/dL

## 2016-05-12 LAB — URINALYSIS, ROUTINE W REFLEX MICROSCOPIC
Bilirubin Urine: NEGATIVE
GLUCOSE, UA: NEGATIVE
Ketones, ur: NEGATIVE
LEUKOCYTES UA: NEGATIVE
Nitrite: NEGATIVE
PH: 5.5 (ref 5.0–8.0)
Protein, ur: NEGATIVE
Specific Gravity, Urine: 1.022 (ref 1.001–1.035)

## 2016-05-12 LAB — INSULIN, RANDOM: INSULIN: 9.5 u[IU]/mL (ref 2.0–19.6)

## 2016-05-12 LAB — VITAMIN D 25 HYDROXY (VIT D DEFICIENCY, FRACTURES): VIT D 25 HYDROXY: 16 ng/mL — AB (ref 30–100)

## 2016-05-12 LAB — MICROALBUMIN / CREATININE URINE RATIO
Creatinine, Urine: 213 mg/dL (ref 20–320)
MICROALB/CREAT RATIO: 5 ug/mg{creat} (ref <30)
Microalb, Ur: 1 mg/dL

## 2016-08-19 ENCOUNTER — Encounter: Payer: Self-pay | Admitting: *Deleted

## 2016-09-02 ENCOUNTER — Encounter: Payer: Self-pay | Admitting: *Deleted

## 2016-09-07 ENCOUNTER — Other Ambulatory Visit: Payer: Self-pay

## 2016-09-07 MED ORDER — BISOPROLOL-HYDROCHLOROTHIAZIDE 2.5-6.25 MG PO TABS: 0.5000 | ORAL_TABLET | Freq: Two times a day (BID) | ORAL | 0 refills | Status: DC

## 2016-09-08 ENCOUNTER — Ambulatory Visit (INDEPENDENT_AMBULATORY_CARE_PROVIDER_SITE_OTHER): Payer: Self-pay

## 2016-09-08 DIAGNOSIS — Z111 Encounter for screening for respiratory tuberculosis: Secondary | ICD-10-CM

## 2016-09-08 NOTE — Progress Notes (Signed)
PT PRESENTS FOR PPD TESTING FOR A JOB. PT HAD NO CONCERNS OR ISSUES AT THIS TIME.  PT SEEMED TO HAVE NO ISSUES WITH THE PLACEMENT OF THE PPD.  PT WAS TOLD TO RETURN ON Thursday TO HAVE TEST READ.

## 2016-10-01 ENCOUNTER — Ambulatory Visit: Payer: Self-pay | Admitting: Physician Assistant

## 2017-02-18 ENCOUNTER — Ambulatory Visit: Payer: Self-pay | Admitting: Physician Assistant

## 2017-02-20 ENCOUNTER — Other Ambulatory Visit: Payer: Self-pay | Admitting: Physician Assistant

## 2017-04-16 IMAGING — CT CT CERVICAL SPINE W/O CM
2 series · 10 of 14 positions shown, 12 images · non-contrast
Comparison: CT cervical spine 07/18/2015

CLINICAL DATA: Follow-up fracture C1 . Initial encounter for closed
fracture.

EXAM:
CT CERVICAL SPINE WITHOUT CONTRAST
TECHNIQUE: Multidetector CT imaging of the cervical spine was performed without
intravenous contrast. Multiplanar CT image reconstructions were also
generated.

[Series 3: cspine soft · axial · 0.23mm/px · z∈[-96,+18]mm · 5 of 87 slices shown]
[im 15/87  soft-tissue]
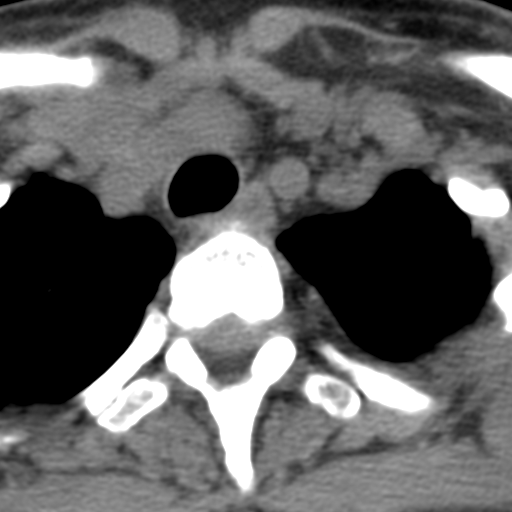
[im 29/87  soft-tissue]
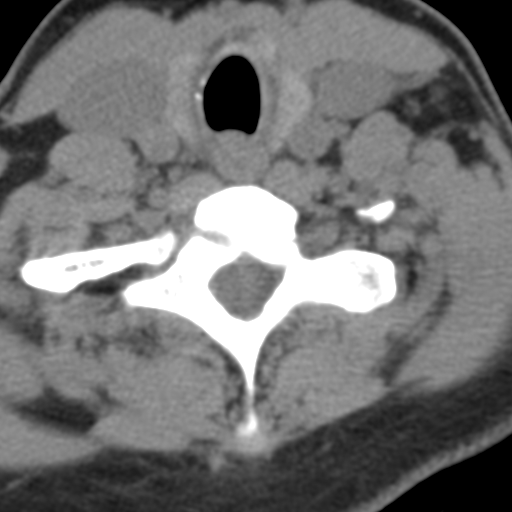
[im 44/87  soft-tissue]
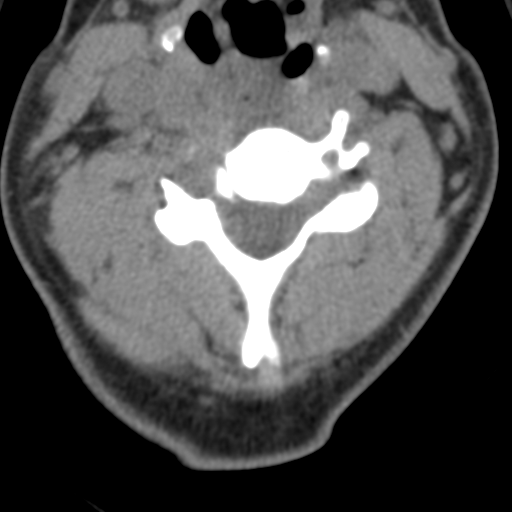
[im 58/87  soft-tissue]
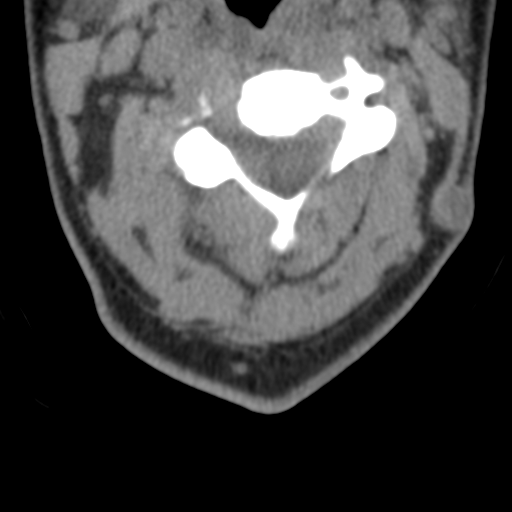
[im 72/87  soft-tissue]
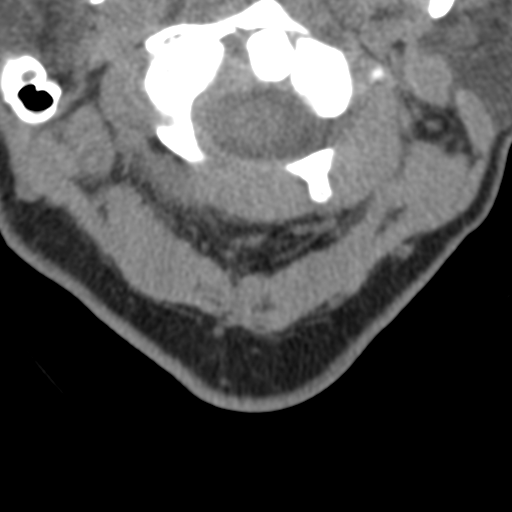

[Series 602: angled axial · axial · 0.21mm/px · z∈[-100,-2]mm · 5 of 82 slices shown, 7 images]
[im 14/82  soft-tissue]
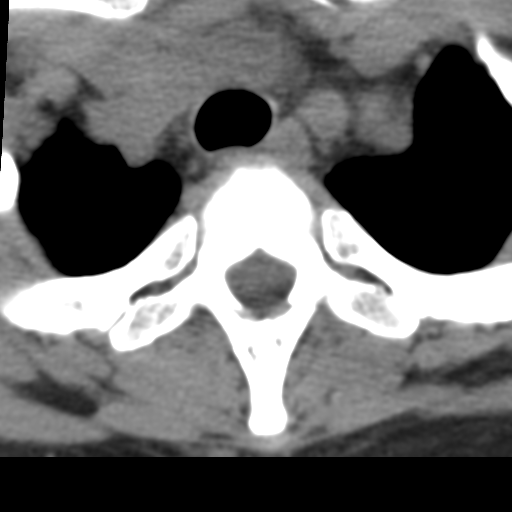
[im 14/82  bone]
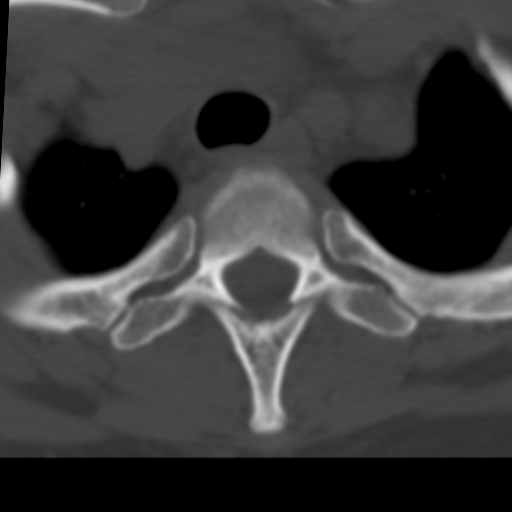
[im 28/82  bone]
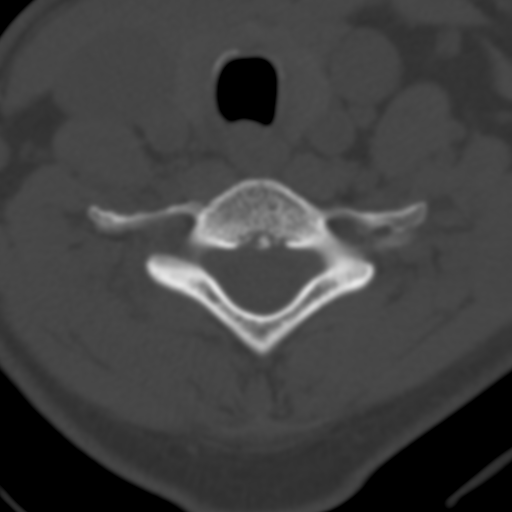
[im 41/82  bone]
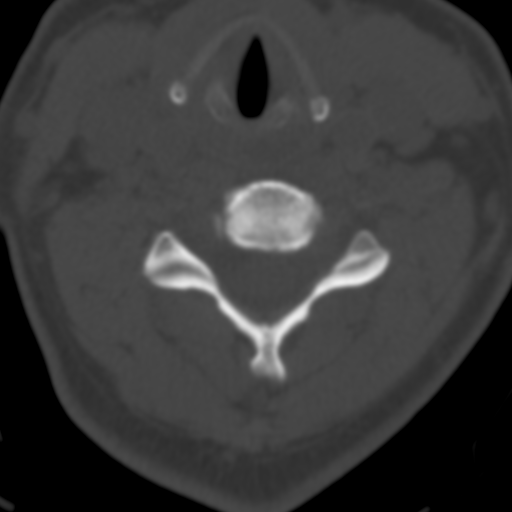
[im 55/82  bone]
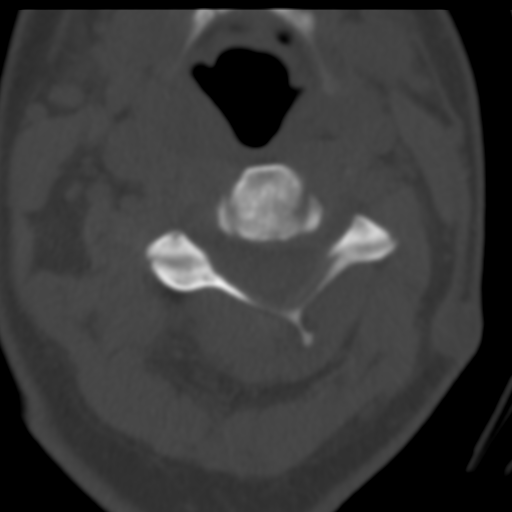
[im 68/82  soft-tissue]
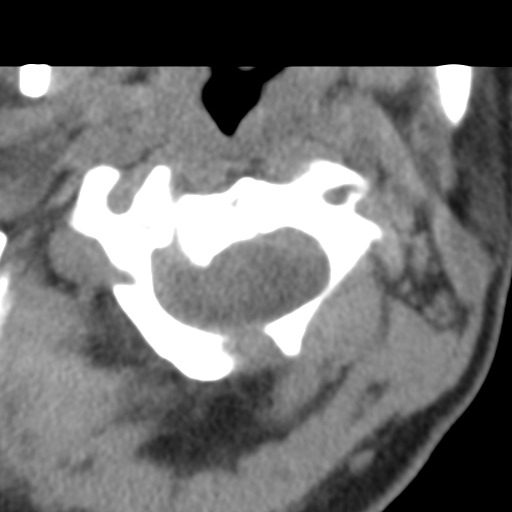
[im 68/82  bone]
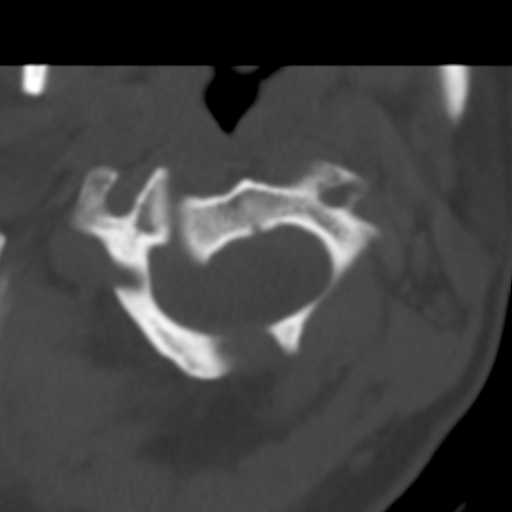

[10 of 14 positions shown; findings below may reference images not displayed]

FINDINGS: Bilateral fractures of the anterior arch of C1 again noted.
Increased over riding of the fracture on the left. Early bony union
left greater than right.

There is right lateral subluxation of C1 relative to C2 which has
developed since the prior study. The left lateral mass is touching
the dens. The right lateral mass is displaced 6 mm laterally. This
subluxation indicates underlying ligamentous injury. No fracture of
the dens.

Progressive levoscoliosis at C2-3. Disc degeneration and mild
spurring at C5-6 and C6-7.
IMPRESSION: Bilateral fracture of the anterior arch of C1 with partial bony
healing left greater than right. Interval development of right
lateral subluxation of C1 on C2 suggesting ligament injury. No other
fracture identified. Progressive levoscoliosis at C2-3.

## 2017-05-11 ENCOUNTER — Encounter: Payer: Self-pay | Admitting: Internal Medicine

## 2017-05-24 ENCOUNTER — Encounter: Payer: Self-pay | Admitting: Physician Assistant
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
## 2017-05-26 ENCOUNTER — Other Ambulatory Visit: Payer: Self-pay | Admitting: Physician Assistant

## 2017-05-26 ENCOUNTER — Ambulatory Visit: Payer: BC Managed Care – PPO | Admitting: Physician Assistant

## 2017-05-26 ENCOUNTER — Encounter: Payer: Self-pay | Admitting: Physician Assistant

## 2017-05-26 VITALS — BP 132/90 | HR 73 | Temp 97.9°F | Resp 18 | Ht 63.0 in | Wt 203.4 lb

## 2017-05-26 DIAGNOSIS — F3341 Major depressive disorder, recurrent, in partial remission: Secondary | ICD-10-CM

## 2017-05-26 DIAGNOSIS — J309 Allergic rhinitis, unspecified: Secondary | ICD-10-CM

## 2017-05-26 DIAGNOSIS — Z130 Encounter for screening for diseases of the blood and blood-forming organs and certain disorders involving the immune mechanism: Secondary | ICD-10-CM | POA: Diagnosis not present

## 2017-05-26 DIAGNOSIS — I10 Essential (primary) hypertension: Secondary | ICD-10-CM | POA: Diagnosis not present

## 2017-05-26 DIAGNOSIS — E559 Vitamin D deficiency, unspecified: Secondary | ICD-10-CM | POA: Diagnosis not present

## 2017-05-26 DIAGNOSIS — Z0000 Encounter for general adult medical examination without abnormal findings: Secondary | ICD-10-CM | POA: Diagnosis not present

## 2017-05-26 DIAGNOSIS — Z13220 Encounter for screening for lipoid disorders: Secondary | ICD-10-CM | POA: Diagnosis not present

## 2017-05-26 DIAGNOSIS — Z79899 Other long term (current) drug therapy: Secondary | ICD-10-CM | POA: Diagnosis not present

## 2017-05-26 DIAGNOSIS — J45909 Unspecified asthma, uncomplicated: Secondary | ICD-10-CM

## 2017-05-26 DIAGNOSIS — Z6836 Body mass index (BMI) 36.0-36.9, adult: Secondary | ICD-10-CM

## 2017-05-26 DIAGNOSIS — R739 Hyperglycemia, unspecified: Secondary | ICD-10-CM

## 2017-05-26 MED ORDER — CHOLECALCIFEROL 1.25 MG (50000 UT) PO WAFR
WAFER | ORAL | 5 refills | Status: DC
Start: 2017-05-26 — End: 2018-08-19

## 2017-05-26 MED ORDER — BUPROPION HCL ER (XL) 150 MG PO TB24: 150.0000 mg | ORAL_TABLET | ORAL | 2 refills | Status: DC

## 2017-05-26 NOTE — Patient Instructions (Addendum)
Vitamin D goal is between 60-80  Please make sure that you are taking your Vitamin D as directed.   It is very important as a natural anti-inflammatory   helping hair, skin, and nails, as well as reducing stroke and heart attack risk.   It helps your bones and helps with mood.  We want you on at least 5000 IU daily  It also decreases numerous cancer risks so please take it as directed.   Low Vit D is associated with a 200-300% higher risk for CANCER   and 200-300% higher risk for HEART   ATTACK  &  STROKE.    .....................................Marland Kitchen  It is also associated with higher death rate at younger ages,   autoimmune diseases like Rheumatoid arthritis, Lupus, Multiple Sclerosis.     Also many other serious conditions, like depression, Alzheimer's  Dementia, infertility, muscle aches, fatigue, fibromyalgia - just to name a few.  +++++++++++++++++++  Can get liquid vitamin D from [place] here in [place] at  [facility] alternatives [address], [place], [place] [address] Or you can try earth fare   Intermittent fasting is more about strategy than starvation. It's meant to reset your body in different ways, hopefully with fitness and nutrition changes as a result.  Like any big switchover, though, results may vary when it comes down to the individual level. What works for your friends may not work for you, or vice versa. That's why it's helpful to play around with variations on intermittent fasting and healthy habits and find what works best for you.  WHAT IS INTERMITTENT FASTING AND WHY DO IT?  Intermittent fasting doesn't involve specific foods, but rather, a strict schedule regarding when you eat. Also called "time-restricted eating," the tactic has been praised for its contribution to weight loss, improved body composition, and decreased cravings. Preliminary research also suggests it may be beneficial for glucose tolerance, hormone regulation, better muscle mass and  lower body fat.  Part of its appeal is the simplicity of the effort. Unlike some other trends, there's no calculations to intermittent fasting.  You simply eat within a certain block of time, usually a window of 8-10 hours. In the other big block of time - about 14-16 hours, including when you're asleep - you don't eat anything, not even snacks. You can drink water, coffee, tea or any other beverage that doesn't have calories.  For example, if you like having a late dinner, you might skip breakfast and have your first meal at noon and your last meal of the day at 8 p.m., and then not eat until noon again the next day.  IDEAS FOR GETTING STARTED  If you're new to the strategy, it may be helpful to eat within the typical circadian rhythm and keep eating within daylight hours. This can be especially beneficial if you're looking at intermittent fasting for weight-loss goals.  So first try only eating between 12pm to 8pm.  Outside of this time you may have water, black coffee, and hot tea. You may not eat it drink anything that has carbs, sugars, OR artificial sugars like diet soda.   Like any major eating and fitness shift, it can take time to find the perfect fit, so don't be afraid to experiment with different options - including ditching intermittent fasting altogether if it's simply not for you. But if it is, you may be surprised by some of the benefits that come along with the strategy.  Being dehydrated can hurt your kidneys,  cause fatigue, headaches, muscle aches, joint pain, and dry skin/nails so please increase your fluids.   Drink 80-100 oz a day of water, measure it out!  Can check out [employer] app on your phone to help you keep track of your water  Are you an emotional eater? Do you eat more when you're feeling stressed? Do you eat when you're not hungry or when you're full? Do you eat to feel better (to calm and soothe yourself when you're sad, mad, bored, anxious, etc.)? Do you  reward yourself with food? Do you regularly eat until you've stuffed yourself? Does food make you feel safe? Do you feel like food is a friend? Do you feel powerless or out of control around food?  If you answered yes to some of these questions than it is likely that you are an emotional eater. This is normally a learned behavior and can take time to first recognize the signs and second BREAK THE HABIT. But here is more information and tips to help.   The difference between emotional hunger and physical hunger Emotional hunger can be powerful, so it's easy to mistake it for physical hunger. But there are clues you can look for to help you tell physical and emotional hunger apart.  Emotional hunger comes on suddenly. It hits you in an instant and feels overwhelming and urgent. Physical hunger, on the other hand, comes on more gradually. The urge to eat doesn't feel as dire or demand instant satisfaction (unless you haven't eaten for a very long time).  Emotional hunger craves specific comfort foods. When you're physically hungry, almost anything sounds good-including healthy stuff like vegetables. But emotional hunger craves junk food or sugary snacks that provide an instant rush. You feel like you need cheesecake or pizza, and nothing else will do.  Emotional hunger often leads to mindless eating. Before you know it, you've eaten a whole bag of chips or an entire pint of ice cream without really paying attention or fully enjoying it. When you're eating in response to physical hunger, you're typically more aware of what you're doing.  Emotional hunger isn't satisfied once you're full. You keep wanting more and more, often eating until you're uncomfortably stuffed. Physical hunger, on the other hand, doesn't need to be stuffed. You feel satisfied when your stomach is full.  Emotional hunger isn't located in the stomach. Rather than a growling belly or a pang in your stomach, you feel your hunger as a  craving you can't get out of your head. You're focused on specific textures, tastes, and smells.  Emotional hunger often leads to regret, guilt, or shame. When you eat to satisfy physical hunger, you're unlikely to feel guilty or ashamed because you're simply giving your body what it needs. If you feel guilty after you eat, it's likely because you know deep down that you're not eating for nutritional reasons.  Identify your emotional eating triggers What situations, places, or feelings make you reach for the comfort of food? Most emotional eating is linked to unpleasant feelings, but it can also be triggered by positive emotions, such as rewarding yourself for achieving a goal or celebrating a holiday or happy event. Common causes of emotional eating include:  Stuffing emotions - Eating can be a way to temporarily silence or "stuff down" uncomfortable emotions, including anger, fear, sadness, anxiety, loneliness, resentment, and shame. While you're numbing yourself with food, you can avoid the difficult emotions you'd rather not feel.  Boredom or feelings of emptiness -  Do you ever eat simply to give yourself something to do, to relieve boredom, or as a way to fill a void in your life? You feel unfulfilled and empty, and food is a way to occupy your mouth and your time. In the moment, it fills you up and distracts you from underlying feelings of purposelessness and dissatisfaction with your life.  Childhood habits - Think back to your childhood memories of food. Did your parents reward good behavior with ice cream, take you out for pizza when you got a good report card, or serve you sweets when you were feeling sad? These habits can often carry over into adulthood. Or your eating may be driven by nostalgia-for cherished memories of grilling burgers in the backyard with your dad or baking and eating cookies with your mom.  Social influences - Getting together with other people for a meal is a great way  to relieve stress, but it can also lead to overeating. It's easy to overindulge simply because the food is there or because everyone else is eating. You may also overeat in social situations out of nervousness. Or perhaps your family or circle of friends encourages you to overeat, and it's easier to go along with the group.  Stress - Ever notice how stress makes you hungry? It's not just in your mind. When stress is chronic, as it so often is in our chaotic, fast-paced world, your body produces high levels of the stress hormone, cortisol. Cortisol triggers cravings for salty, sweet, and fried foods-foods that give you a burst of energy and pleasure. The more uncontrolled stress in your life, the more likely you are to turn to food for emotional relief.  Find other ways to feed your feelings If you don't know how to manage your emotions in a way that doesn't involve food, you won't be able to control your eating habits for very long. Diets so often fail because they offer logical nutritional advice which only works if you have conscious control over your eating habits. It doesn't work when emotions hijack the process, demanding an immediate payoff with food.  In order to stop emotional eating, you have to find other ways to fulfill yourself emotionally. It's not enough to understand the cycle of emotional eating or even to understand your triggers, although that's a huge first step. You need alternatives to food that you can turn to for emotional fulfillment.  Alternatives to emotional eating If you're depressed or lonely, call someone who always makes you feel better, play with your dog or cat, or look at a favorite photo or cherished memento.  If you're anxious, expend your nervous energy by dancing to your favorite song, squeezing a stress ball, or taking a brisk walk.  If you're exhausted, treat yourself with a hot cup of tea, take a bath, light some scented candles, or wrap yourself in a warm  blanket.  If you're bored, read a good book, watch a comedy show, explore the outdoors, or turn to an activity you enjoy (woodworking, playing the guitar, shooting hoops, scrapbooking, etc.).  What is mindful eating? Mindful eating is a practice that develops your awareness of eating habits and allows you to pause between your triggers and your actions. Most emotional eaters feel powerless over their food cravings. When the urge to eat hits, you feel an almost unbearable tension that demands to be fed, right now. Because you've tried to resist in the past and failed, you believe that your willpower just isn't  up to snuff. But the truth is that you have more power over your cravings than you think.  Take 5 before you give in to a craving Emotional eating tends to be automatic and virtually mindless. Before you even realize what you're doing, you've reached for a tub of ice cream and polished off half of it. But if you can take a moment to pause and reflect when you're hit with a craving, you give yourself the opportunity to make a different decision.  Can you put off eating for five minutes? Or just start with one minute. Don't tell yourself you can't give in to the craving; remember, the forbidden is extremely tempting. Just tell yourself to wait.  While you're waiting, check in with yourself. How are you feeling? What's going on emotionally? Even if you end up eating, you'll have a better understanding of why you did it. This can help you set yourself up for a different response next time.  How to practice mindful eating Eating while you're also doing other things-such as watching TV, driving, or playing with your phone-can prevent you from fully enjoying your food. Since your mind is elsewhere, you may not feel satisfied or continue eating even though you're no longer hungry. Eating more mindfully can help focus your mind on your food and the pleasure of a meal and curb overeating.   Eat your  meals in a calm place with no distractions, aside from any dining companions.  Try eating with your non-dominant hand or using chopsticks instead of a knife and fork. Eating in such a non-familiar way can slow down how fast you eat and ensure your mind stays focused on your food.  Allow yourself enough time not to have to rush your meal. Set a timer for 20 minutes and pace yourself so you spend at least that much time eating.  Take small bites and chew them well, taking time to notice the different flavors and textures of each mouthful.  Put your utensils down between bites. Take time to consider how you feel-hungry, satiated-before picking up your utensils again.  Try to stop eating before you are full.It takes time for the signal to reach your brain that you've had enough. Don't feel obligated to always clean your plate.  When you've finished your food, take a few moments to assess if you're really still hungry before opting for an extra serving or dessert.  Learn to accept your feelings-even the bad ones  While it may seem that the core problem is that you're powerless over food, emotional eating actually stems from feeling powerless over your emotions. You don't feel capable of dealing with your feelings head on, so you avoid them with food.  Recommended reading  Mini Habits for weight loss- get this books  Healthy Eating: A guide to the new nutrition - [place] Report  10 Tips for Mindful Eating - How mindfulness can help you fully enjoy a meal and the experience of eating-with moderation and restraint. ([facility])  Weight Loss: Gain Control of Emotional Eating - Tips to regain control of your eating habits. [facility])  Why Stress Causes People to Overeat -Tips on controlling stress eating. ([place])  Mindful Eating Meditations -Free online mindfulness meditations. ([facility] [employer])      Are you an  emotional eater? Do you eat more when you're feeling stressed? Do you eat when you're not hungry or when you're full? Do you  eat to feel better (to calm and soothe yourself when you're sad, mad, bored, anxious, etc.)? Do you reward yourself with food? Do you regularly eat until you've stuffed yourself? Does food make you feel safe? Do you feel like food is a friend? Do you feel powerless or out of control around food?  If you answered yes to some of these questions than it is likely that you are an emotional eater. This is normally a learned behavior and can take time to first recognize the signs and second BREAK THE HABIT. But here is more information and tips to help.   The difference between emotional hunger and physical hunger Emotional hunger can be powerful, so it's easy to mistake it for physical hunger. But there are clues you can look for to help you tell physical and emotional hunger apart.  Emotional hunger comes on suddenly. It hits you in an instant and feels overwhelming and urgent. Physical hunger, on the other hand, comes on more gradually. The urge to eat doesn't feel as dire or demand instant satisfaction (unless you haven't eaten for a very long time).  Emotional hunger craves specific comfort foods. When you're physically hungry, almost anything sounds good-including healthy stuff like vegetables. But emotional hunger craves junk food or sugary snacks that provide an instant rush. You feel like you need cheesecake or pizza, and nothing else will do.  Emotional hunger often leads to mindless eating. Before you know it, you've eaten a whole bag of chips or an entire pint of ice cream without really paying attention or fully enjoying it. When you're eating in response to physical hunger, you're typically more aware of what you're doing.  Emotional hunger isn't satisfied once you're full. You keep wanting more and more, often eating until you're uncomfortably stuffed. Physical  hunger, on the other hand, doesn't need to be stuffed. You feel satisfied when your stomach is full.  Emotional hunger isn't located in the stomach. Rather than a growling belly or a pang in your stomach, you feel your hunger as a craving you can't get out of your head. You're focused on specific textures, tastes, and smells.  Emotional hunger often leads to regret, guilt, or shame. When you eat to satisfy physical hunger, you're unlikely to feel guilty or ashamed because you're simply giving your body what it needs. If you feel guilty after you eat, it's likely because you know deep down that you're not eating for nutritional reasons.  Identify your emotional eating triggers What situations, places, or feelings make you reach for the comfort of food? Most emotional eating is linked to unpleasant feelings, but it can also be triggered by positive emotions, such as rewarding yourself for achieving a goal or celebrating a holiday or happy event. Common causes of emotional eating include:  Stuffing emotions - Eating can be a way to temporarily silence or "stuff down" uncomfortable emotions, including anger, fear, sadness, anxiety, loneliness, resentment, and shame. While you're numbing yourself with food, you can avoid the difficult emotions you'd rather not feel.  Boredom or feelings of emptiness - Do you ever eat simply to give yourself something to do, to relieve boredom, or as a way to fill a void in your life? You feel unfulfilled and empty, and food is a way to occupy your mouth and your time. In the moment, it fills you up and distracts you from underlying feelings of purposelessness and dissatisfaction with your life.  Childhood habits - Think back to your childhood  memories of food. Did your parents reward good behavior with ice cream, take you out for pizza when you got a good report card, or serve you sweets when you were feeling sad? These habits can often carry over into adulthood. Or your  eating may be driven by nostalgia-for cherished memories of grilling burgers in the backyard with your dad or baking and eating cookies with your mom.  Social influences - Getting together with other people for a meal is a great way to relieve stress, but it can also lead to overeating. It's easy to overindulge simply because the food is there or because everyone else is eating. You may also overeat in social situations out of nervousness. Or perhaps your family or circle of friends encourages you to overeat, and it's easier to go along with the group.  Stress - Ever notice how stress makes you hungry? It's not just in your mind. When stress is chronic, as it so often is in our chaotic, fast-paced world, your body produces high levels of the stress hormone, cortisol. Cortisol triggers cravings for salty, sweet, and fried foods-foods that give you a burst of energy and pleasure. The more uncontrolled stress in your life, the more likely you are to turn to food for emotional relief.  Find other ways to feed your feelings If you don't know how to manage your emotions in a way that doesn't involve food, you won't be able to control your eating habits for very long. Diets so often fail because they offer logical nutritional advice which only works if you have conscious control over your eating habits. It doesn't work when emotions hijack the process, demanding an immediate payoff with food.  In order to stop emotional eating, you have to find other ways to fulfill yourself emotionally. It's not enough to understand the cycle of emotional eating or even to understand your triggers, although that's a huge first step. You need alternatives to food that you can turn to for emotional fulfillment.  Alternatives to emotional eating If you're depressed or lonely, call someone who always makes you feel better, play with your dog or cat, or look at a favorite photo or cherished memento.  If you're anxious, expend  your nervous energy by dancing to your favorite song, squeezing a stress ball, or taking a brisk walk.  If you're exhausted, treat yourself with a hot cup of tea, take a bath, light some scented candles, or wrap yourself in a warm blanket.  If you're bored, read a good book, watch a comedy show, explore the outdoors, or turn to an activity you enjoy (woodworking, playing the guitar, shooting hoops, scrapbooking, etc.).  What is mindful eating? Mindful eating is a practice that develops your awareness of eating habits and allows you to pause between your triggers and your actions. Most emotional eaters feel powerless over their food cravings. When the urge to eat hits, you feel an almost unbearable tension that demands to be fed, right now. Because you've tried to resist in the past and failed, you believe that your willpower just isn't up to snuff. But the truth is that you have more power over your cravings than you think.  Take 5 before you give in to a craving Emotional eating tends to be automatic and virtually mindless. Before you even realize what you're doing, you've reached for a tub of ice cream and polished off half of it. But if you can take a moment to pause and reflect when you're  hit with a craving, you give yourself the opportunity to make a different decision.  Can you put off eating for five minutes? Or just start with one minute. Don't tell yourself you can't give in to the craving; remember, the forbidden is extremely tempting. Just tell yourself to wait.  While you're waiting, check in with yourself. How are you feeling? What's going on emotionally? Even if you end up eating, you'll have a better understanding of why you did it. This can help you set yourself up for a different response next time.  How to practice mindful eating Eating while you're also doing other things-such as watching TV, driving, or playing with your phone-can prevent you from fully enjoying your food. Since  your mind is elsewhere, you may not feel satisfied or continue eating even though you're no longer hungry. Eating more mindfully can help focus your mind on your food and the pleasure of a meal and curb overeating.   Eat your meals in a calm place with no distractions, aside from any dining companions.  Try eating with your non-dominant hand or using chopsticks instead of a knife and fork. Eating in such a non-familiar way can slow down how fast you eat and ensure your mind stays focused on your food.  Allow yourself enough time not to have to rush your meal. Set a timer for 20 minutes and pace yourself so you spend at least that much time eating.  Take small bites and chew them well, taking time to notice the different flavors and textures of each mouthful.  Put your utensils down between bites. Take time to consider how you feel-hungry, satiated-before picking up your utensils again.  Try to stop eating before you are full.It takes time for the signal to reach your brain that you've had enough. Don't feel obligated to always clean your plate.  When you've finished your food, take a few moments to assess if you're really still hungry before opting for an extra serving or dessert.  Learn to accept your feelings-even the bad ones  While it may seem that the core problem is that you're powerless over food, emotional eating actually stems from feeling powerless over your emotions. You don't feel capable of dealing with your feelings head on, so you avoid them with food.  Recommended reading  Mini Habits for weight loss  Healthy Eating: A guide to the new nutrition - [place] Report  10 Tips for Mindful Eating - How mindfulness can help you fully enjoy a meal and the experience of eating-with moderation and restraint. ([place])  Weight Loss: Gain Control of Emotional Eating - Tips to regain control of your eating habits. [facility])  Why Stress  Causes People to Overeat -Tips on controlling stress eating. ([place])  Mindful Eating Meditations -Free online mindfulness meditations. ([facility] [employer])     10 Tips on Belching, Bloating, and Flatulence 1. Belching is caused by swallowed air from:  Eating or drinking too fast  Poorly fitting dentures; not chewing food completely  Carbonated beverages  Chewing gum or sucking on hard candies  Excessive swallowing due to nervous tension or postnasal drip  Forced belching to relieve abdominal discomfort 2. To prevent excessive belching, avoid:  Carbonated beverages  Chewing gum  Hard candies  Simethicone/GasX may be helpful  3. Abdominal bloating and discomfort may be due to intestinal sensitivity or symptoms of irritable bowel syndrome. To relieve symptoms, avoid:  Broccoli  Baked beans  Cabbage  Carbonated drinks  Cauliflower  Chewing gum  Hard candy 4. Abdominal distention resulting from weak abdominal muscles:  Is better in the morning  Gets worse as the day progresses  Is relieved by lying down 5. To prevent Abdominal distention:  Tighten abdominal muscles by pulling in your stomach several times during the day  Do sit-up exercises if possible  Wear an abdominal support garment if exercise is too difficult 6. Flatulence is gas created through bacterial action in the bowel and passed rectally. Keep in mind that:  10-18 passages per day are normal  Primary gases are harmless and odorless  Noticeable smells are trace gases related to food intake 7. Foods that are likely to form gas include:  Milk, dairy products, and medications that contain lactose--If your body doesn't produce the enzyme (lactase) to break it down.  Certain vegetables--baked beans, cauliflower, broccoli, cabbage  Certain starches--wheat, oats, corn, potatoes. Rice is a good substitute. 8. Identify offending foods. Reduce or eliminate these gas-forming foods from your  diet.

## 2017-05-27 LAB — HEPATIC FUNCTION PANEL
AG RATIO: 1.2 (calc) (ref 1.0–2.5)
ALT: 15 U/L (ref 6–29)
AST: 17 U/L (ref 10–30)
Albumin: 4.2 g/dL (ref 3.6–5.1)
Alkaline phosphatase (APISO): 64 U/L (ref 33–115)
BILIRUBIN DIRECT: 0.1 mg/dL (ref 0.0–0.2)
BILIRUBIN INDIRECT: 0.2 mg/dL (ref 0.2–1.2)
BILIRUBIN TOTAL: 0.3 mg/dL (ref 0.2–1.2)
Globulin: 3.5 g/dL (calc) (ref 1.9–3.7)
Total Protein: 7.7 g/dL (ref 6.1–8.1)

## 2017-05-27 LAB — BASIC METABOLIC PANEL WITH GFR
BUN: 13 mg/dL (ref 7–25)
CO2: 24 mmol/L (ref 20–32)
CREATININE: 1.03 mg/dL (ref 0.50–1.10)
Calcium: 9.5 mg/dL (ref 8.6–10.2)
Chloride: 102 mmol/L (ref 98–110)
GFR, EST NON AFRICAN AMERICAN: 67 mL/min/{1.73_m2} (ref >=60)
GFR, Est African American: 78 mL/min/{1.73_m2} (ref >=60)
Glucose, Bld: 86 mg/dL (ref 65–99)
POTASSIUM: 4.1 mmol/L (ref 3.5–5.3)
SODIUM: 135 mmol/L (ref 135–146)

## 2017-05-27 LAB — CBC WITH DIFFERENTIAL/PLATELET
BASOS PCT: 0.4 %
Basophils Absolute: 28 cells/uL (ref 0–200)
EOS ABS: 182 {cells}/uL (ref 15–500)
Eosinophils Relative: 2.6 %
HCT: 32.3 % — ABNORMAL LOW (ref 35.0–45.0)
HEMOGLOBIN: 10.1 g/dL — AB (ref 11.7–15.5)
Lymphs Abs: 3724 cells/uL (ref 850–3900)
MCH: 24 pg — ABNORMAL LOW (ref 27.0–33.0)
MCHC: 31.3 g/dL — ABNORMAL LOW (ref 32.0–36.0)
MCV: 76.9 fL — ABNORMAL LOW (ref 80.0–100.0)
MONOS PCT: 5.3 %
MPV: 9.8 fL (ref 7.5–12.5)
NEUTROS ABS: 2695 {cells}/uL (ref 1500–7800)
Neutrophils Relative %: 38.5 %
PLATELETS: 489 10*3/uL — AB (ref 140–400)
RBC: 4.2 10*6/uL (ref 3.80–5.10)
RDW: 14.1 % (ref 11.0–15.0)
TOTAL LYMPHOCYTE: 53.2 %
WBC mixed population: 371 cells/uL (ref 200–950)
WBC: 7 10*3/uL (ref 3.8–10.8)

## 2017-05-27 LAB — URINALYSIS, ROUTINE W REFLEX MICROSCOPIC
BILIRUBIN URINE: NEGATIVE
Bacteria, UA: NONE SEEN /HPF
Glucose, UA: NEGATIVE
Hyaline Cast: NONE SEEN /LPF
Ketones, ur: NEGATIVE
Leukocytes, UA: NEGATIVE
NITRITE: NEGATIVE
Protein, ur: NEGATIVE
SPECIFIC GRAVITY, URINE: 1.014 (ref 1.001–1.03)
SQUAMOUS EPITHELIAL / LPF: NONE SEEN /HPF (ref <=5)

## 2017-05-27 LAB — IRON, TOTAL/TOTAL IRON BINDING CAP
%SAT: 5 % — AB (ref 11–50)
IRON: 21 ug/dL — AB (ref 40–190)
TIBC: 408 ug/dL (ref 250–450)

## 2017-05-27 LAB — HEMOGLOBIN A1C
EAG (MMOL/L): 6.8 (calc)
HEMOGLOBIN A1C: 5.9 %{Hb} — AB (ref <5.7)
MEAN PLASMA GLUCOSE: 123 (calc)

## 2017-05-27 LAB — TSH: TSH: 1.35 mIU/L

## 2017-05-27 LAB — MAGNESIUM: Magnesium: 1.8 mg/dL (ref 1.5–2.5)

## 2017-05-27 LAB — LIPID PANEL
CHOL/HDL RATIO: 4.5 (calc) (ref <5.0)
Cholesterol: 193 mg/dL (ref <200)
HDL: 43 mg/dL — ABNORMAL LOW (ref >50)
LDL Cholesterol (Calc): 120 mg/dL (calc) — ABNORMAL HIGH
NON-HDL CHOLESTEROL (CALC): 150 mg/dL — AB (ref <130)
Triglycerides: 189 mg/dL — ABNORMAL HIGH (ref <150)

## 2017-05-27 LAB — VITAMIN B12: Vitamin B-12: 522 pg/mL (ref 200–1100)

## 2017-05-27 LAB — VITAMIN D 25 HYDROXY (VIT D DEFICIENCY, FRACTURES): Vit D, 25-Hydroxy: 20 ng/mL — ABNORMAL LOW (ref 30–100)

## 2017-05-27 LAB — MICROALBUMIN / CREATININE URINE RATIO
CREATININE, URINE: 99 mg/dL (ref 20–275)
Microalb Creat Ratio: 5 mcg/mg creat (ref <30)
Microalb, Ur: 0.5 mg/dL

## 2017-06-02 LAB — HM MAMMOGRAPHY

## 2017-07-13 ENCOUNTER — Encounter (INDEPENDENT_AMBULATORY_CARE_PROVIDER_SITE_OTHER): Payer: Self-pay

## 2017-07-13 NOTE — Progress Notes (Signed)
SENT A MY CHART MESSAGE TO CALL & SCHEDULE -SB 07/13/17

## 2017-07-28 ENCOUNTER — Other Ambulatory Visit: Payer: Self-pay | Admitting: Physician Assistant

## 2017-08-05 ENCOUNTER — Encounter (INDEPENDENT_AMBULATORY_CARE_PROVIDER_SITE_OTHER): Payer: Self-pay

## 2017-08-10 ENCOUNTER — Encounter (INDEPENDENT_AMBULATORY_CARE_PROVIDER_SITE_OTHER): Payer: Self-pay

## 2017-09-17 ENCOUNTER — Other Ambulatory Visit: Payer: Self-pay | Admitting: Physician Assistant

## 2017-09-30 ENCOUNTER — Encounter (INDEPENDENT_AMBULATORY_CARE_PROVIDER_SITE_OTHER): Payer: Self-pay

## 2017-10-13 ENCOUNTER — Encounter (INDEPENDENT_AMBULATORY_CARE_PROVIDER_SITE_OTHER): Payer: Self-pay

## 2017-11-12 ENCOUNTER — Encounter: Payer: Self-pay | Admitting: Adult Health

## 2017-11-12 DIAGNOSIS — E785 Hyperlipidemia, unspecified: Secondary | ICD-10-CM | POA: Insufficient documentation
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
## 2017-11-15 ENCOUNTER — Encounter: Payer: Self-pay | Admitting: Adult Health

## 2017-11-15 ENCOUNTER — Ambulatory Visit (INDEPENDENT_AMBULATORY_CARE_PROVIDER_SITE_OTHER): Payer: BC Managed Care – PPO | Admitting: Adult Health

## 2017-11-15 VITALS — BP 114/72 | HR 84 | Temp 97.5°F | Ht 63.0 in | Wt 197.0 lb

## 2017-11-15 DIAGNOSIS — R7303 Prediabetes: Secondary | ICD-10-CM

## 2017-11-15 DIAGNOSIS — I10 Essential (primary) hypertension: Secondary | ICD-10-CM | POA: Diagnosis not present

## 2017-11-15 DIAGNOSIS — E782 Mixed hyperlipidemia: Secondary | ICD-10-CM | POA: Diagnosis not present

## 2017-11-15 DIAGNOSIS — F3341 Major depressive disorder, recurrent, in partial remission: Secondary | ICD-10-CM | POA: Diagnosis not present

## 2017-11-15 DIAGNOSIS — Z79899 Other long term (current) drug therapy: Secondary | ICD-10-CM | POA: Diagnosis not present

## 2017-11-15 DIAGNOSIS — E559 Vitamin D deficiency, unspecified: Secondary | ICD-10-CM | POA: Diagnosis not present

## 2017-11-15 NOTE — Patient Instructions (Signed)
  Weight goal for next visit: 185 lb (my goal is to get your labs down to normal)    Aim for 7+ servings of fruits and vegetables daily  65-80+ fluid ounces of water or unsweet tea for healthy kidneys  Limit to max 1 drink of alcohol per day; avoid smoking/tobacco  Limit animal fats in diet for cholesterol and heart health - choose grass fed whenever available  Avoid highly processed foods, and foods high in saturated/trans fats  Aim for low stress - take time to unwind and care for your mental health  Aim for 150 min of moderate intensity exercise weekly for heart health, and weights twice weekly for bone health  Aim for 7-9 hours of sleep daily      When it comes to diets, agreement about the perfect plan isn't easy to find, even among the experts. Experts at the [employer] developed an idea known as the Healthy Eating Plate. Just imagine a plate divided into logical, healthy portions.  The emphasis is on diet quality:  Load up on vegetables and fruits - one-half of your plate: Aim for color and variety, and remember that potatoes don't count.  Go for whole grains - one-quarter of your plate: Whole wheat, barley, wheat berries, quinoa, oats, brown rice, and foods made with them. If you want pasta, go with whole wheat pasta.  Protein power - one-quarter of your plate: Fish, chicken, beans, and nuts are all healthy, versatile protein sources. Limit red meat.  The diet, however, does go beyond the plate, offering a few other suggestions.  Use healthy plant oils, such as olive, canola, soy, corn, sunflower and peanut. Check the labels, and avoid partially hydrogenated oil, which have unhealthy trans fats.  If you're thirsty, drink water. Coffee and tea are good in moderation, but skip sugary drinks and limit milk and dairy products to one or two daily servings.  The type of carbohydrate in the diet is more important than the amount. Some sources of  carbohydrates, such as vegetables, fruits, whole grains, and beans-are healthier than others.  Finally, stay active.

## 2017-11-16 LAB — CBC WITH DIFFERENTIAL/PLATELET
BASOS PCT: 0.5 %
Basophils Absolute: 38 cells/uL (ref 0–200)
EOS ABS: 173 {cells}/uL (ref 15–500)
EOS PCT: 2.3 %
HEMATOCRIT: 32.1 % — AB (ref 35.0–45.0)
HEMOGLOBIN: 10 g/dL — AB (ref 11.7–15.5)
LYMPHS ABS: 2978 {cells}/uL (ref 850–3900)
MCH: 23.8 pg — ABNORMAL LOW (ref 27.0–33.0)
MCHC: 31.2 g/dL — ABNORMAL LOW (ref 32.0–36.0)
MCV: 76.4 fL — ABNORMAL LOW (ref 80.0–100.0)
MPV: 9.6 fL (ref 7.5–12.5)
Monocytes Relative: 5.9 %
NEUTROS ABS: 3870 {cells}/uL (ref 1500–7800)
Neutrophils Relative %: 51.6 %
Platelets: 438 10*3/uL — ABNORMAL HIGH (ref 140–400)
RBC: 4.2 10*6/uL (ref 3.80–5.10)
RDW: 14.5 % (ref 11.0–15.0)
Total Lymphocyte: 39.7 %
WBC mixed population: 443 cells/uL (ref 200–950)
WBC: 7.5 10*3/uL (ref 3.8–10.8)

## 2017-11-16 LAB — COMPLETE METABOLIC PANEL WITH GFR
AG Ratio: 1.1 (calc) (ref 1.0–2.5)
ALBUMIN MSPROF: 3.9 g/dL (ref 3.6–5.1)
ALT: 13 U/L (ref 6–29)
AST: 15 U/L (ref 10–30)
Alkaline phosphatase (APISO): 62 U/L (ref 33–115)
BUN / CREAT RATIO: 13 (calc) (ref 6–22)
BUN: 14 mg/dL (ref 7–25)
CO2: 27 mmol/L (ref 20–32)
CREATININE: 1.12 mg/dL — AB (ref 0.50–1.10)
Calcium: 9.8 mg/dL (ref 8.6–10.2)
Chloride: 106 mmol/L (ref 98–110)
GFR, EST AFRICAN AMERICAN: 71 mL/min/{1.73_m2} (ref >=60)
GFR, EST NON AFRICAN AMERICAN: 61 mL/min/{1.73_m2} (ref >=60)
GLOBULIN: 3.5 g/dL (ref 1.9–3.7)
Glucose, Bld: 105 mg/dL — ABNORMAL HIGH (ref 65–99)
Potassium: 4 mmol/L (ref 3.5–5.3)
SODIUM: 141 mmol/L (ref 135–146)
TOTAL PROTEIN: 7.4 g/dL (ref 6.1–8.1)
Total Bilirubin: 0.4 mg/dL (ref 0.2–1.2)

## 2017-11-16 LAB — HEMOGLOBIN A1C
HEMOGLOBIN A1C: 6.1 %{Hb} — AB (ref <5.7)
Mean Plasma Glucose: 128 (calc)
eAG (mmol/L): 7.1 (calc)

## 2017-11-16 LAB — LIPID PANEL
Cholesterol: 190 mg/dL (ref <200)
HDL: 42 mg/dL — ABNORMAL LOW (ref >50)
LDL Cholesterol (Calc): 119 mg/dL (calc) — ABNORMAL HIGH
Non-HDL Cholesterol (Calc): 148 mg/dL (calc) — ABNORMAL HIGH (ref <130)
TRIGLYCERIDES: 170 mg/dL — AB (ref <150)
Total CHOL/HDL Ratio: 4.5 (calc) (ref <5.0)

## 2017-11-16 LAB — TSH: TSH: 1.41 m[IU]/L

## 2017-11-16 LAB — VITAMIN D 25 HYDROXY (VIT D DEFICIENCY, FRACTURES): VIT D 25 HYDROXY: 24 ng/mL — AB (ref 30–100)

## 2018-04-04 ENCOUNTER — Other Ambulatory Visit: Payer: Self-pay | Admitting: Internal Medicine
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
## 2018-05-31 ENCOUNTER — Encounter: Payer: Self-pay | Admitting: Physician Assistant

## 2018-05-31 ENCOUNTER — Ambulatory Visit: Payer: BC Managed Care – PPO | Admitting: Physician Assistant

## 2018-05-31 VITALS — BP 134/90 | HR 71 | Temp 97.8°F | Ht 63.0 in | Wt 193.4 lb

## 2018-05-31 DIAGNOSIS — R350 Frequency of micturition: Secondary | ICD-10-CM

## 2018-05-31 DIAGNOSIS — F3341 Major depressive disorder, recurrent, in partial remission: Secondary | ICD-10-CM

## 2018-05-31 DIAGNOSIS — Z0000 Encounter for general adult medical examination without abnormal findings: Secondary | ICD-10-CM | POA: Diagnosis not present

## 2018-05-31 DIAGNOSIS — I10 Essential (primary) hypertension: Secondary | ICD-10-CM

## 2018-05-31 DIAGNOSIS — Z130 Encounter for screening for diseases of the blood and blood-forming organs and certain disorders involving the immune mechanism: Secondary | ICD-10-CM | POA: Diagnosis not present

## 2018-05-31 DIAGNOSIS — Z1329 Encounter for screening for other suspected endocrine disorder: Secondary | ICD-10-CM | POA: Diagnosis not present

## 2018-05-31 DIAGNOSIS — J309 Allergic rhinitis, unspecified: Secondary | ICD-10-CM

## 2018-05-31 DIAGNOSIS — Z1389 Encounter for screening for other disorder: Secondary | ICD-10-CM | POA: Diagnosis not present

## 2018-05-31 DIAGNOSIS — Z13220 Encounter for screening for lipoid disorders: Secondary | ICD-10-CM

## 2018-05-31 DIAGNOSIS — Z79899 Other long term (current) drug therapy: Secondary | ICD-10-CM

## 2018-05-31 DIAGNOSIS — Z131 Encounter for screening for diabetes mellitus: Secondary | ICD-10-CM

## 2018-05-31 DIAGNOSIS — R300 Dysuria: Secondary | ICD-10-CM

## 2018-05-31 DIAGNOSIS — Z136 Encounter for screening for cardiovascular disorders: Secondary | ICD-10-CM

## 2018-05-31 DIAGNOSIS — R7303 Prediabetes: Secondary | ICD-10-CM | POA: Diagnosis not present

## 2018-05-31 DIAGNOSIS — E559 Vitamin D deficiency, unspecified: Secondary | ICD-10-CM

## 2018-05-31 DIAGNOSIS — E782 Mixed hyperlipidemia: Secondary | ICD-10-CM

## 2018-05-31 DIAGNOSIS — J45909 Unspecified asthma, uncomplicated: Secondary | ICD-10-CM

## 2018-05-31 NOTE — Patient Instructions (Addendum)
When it comes to diets, agreement about the perfect plan isn't easy to find, even among the experts. Experts at the [employer] developed an idea known as the Healthy Eating Plate. Just imagine a plate divided into logical, healthy portions.  The emphasis is on diet quality:  Load up on vegetables and fruits - one-half of your plate: Aim for color and variety, and remember that potatoes don't count.  Go for whole grains - one-quarter of your plate: Whole wheat, barley, wheat berries, quinoa, oats, brown rice, and foods made with them. If you want pasta, go with whole wheat pasta.  Protein power - one-quarter of your plate: Fish, chicken, beans, and nuts are all healthy, versatile protein sources. Limit red meat.  The diet, however, does go beyond the plate, offering a few other suggestions.  Use healthy plant oils, such as olive, canola, soy, corn, sunflower and peanut. Check the labels, and avoid partially hydrogenated oil, which have unhealthy trans fats.  If you're thirsty, drink water. Coffee and tea are good in moderation, but skip sugary drinks and limit milk and dairy products to one or two daily servings.  The type of carbohydrate in the diet is more important than the amount. Some sources of carbohydrates, such as vegetables, fruits, whole grains, and beans-are healthier than others.  Finally, stay active.  Take omeprazole over the counter for 2 weeks, then go to zantac 150-300 mg OR pepcid 20 or 40mg  at night for 2 weeks, then you can stop or continue as needed.  Avoid alcohol, spicy foods, NSAIDS (aleve, ibuprofen) at this time. See foods below.   Food Choices for Gastroesophageal Reflux Disease When you have gastroesophageal reflux disease (GERD), the foods you eat and your eating habits are very important. Choosing the right foods can help ease the discomfort of GERD. WHAT GENERAL GUIDELINES DO I NEED TO FOLLOW?  Choose fruits, vegetables, whole  grains, low-fat dairy products, and low-fat meat, fish, and poultry.  Limit fats such as oils, salad dressings, butter, nuts, and avocado.  Keep a food diary to identify foods that cause symptoms.  Avoid foods that cause reflux. These may be different for different people.  Eat frequent small meals instead of three large meals each day.  Eat your meals slowly, in a relaxed setting.  Limit fried foods.  Cook foods using methods other than frying.  Avoid drinking alcohol.  Avoid drinking large amounts of liquids with your meals.  Avoid bending over or lying down until 2-3 hours after eating. WHAT FOODS ARE NOT RECOMMENDED? The following are some foods and drinks that may worsen your symptoms: Vegetables Tomatoes. Tomato juice. Tomato and spaghetti sauce. Chili peppers. Onion and garlic. Horseradish. Fruits Oranges, grapefruit, and lemon (fruit and juice). Meats High-fat meats, fish, and poultry. This includes hot dogs, ribs, ham, sausage, salami, and bacon. Dairy Whole milk and chocolate milk. Sour cream. Cream. Butter. Ice cream. Cream cheese.  Beverages Coffee and tea, with or without caffeine. Carbonated beverages or energy drinks. Condiments Hot sauce. Barbecue sauce.  Sweets/Desserts Chocolate and cocoa. Donuts. Peppermint and spearmint. Fats and Oils High-fat foods, including [place] fries and potato chips. Other Vinegar. Strong spices, such as black pepper, white pepper, red pepper, cayenne, curry powder, cloves, ginger, and chili powder.   Google mindful eating and here are some tips and tricks below.   Rate your hunger before you eat on a scale of 1-10, try to eat closer to a 6 or higher.  And if you are at below that, why are you eating? Slow down and listen to your body.       Check out piriformis stretches on you tube  If not better can refer to PT and get imaging Piriformis Syndrome Rehab Ask your health care provider which exercises are safe for you.  Do exercises exactly as told by your health care provider and adjust them as directed. It is normal to feel mild stretching, pulling, tightness, or discomfort as you do these exercises, but you should stop right away if you feel sudden pain or your pain gets worse.Do not begin these exercises until told by your health care provider. Stretching and range of motion exercises These exercises warm up your muscles and joints and improve the movement and flexibility of your hip and pelvis. These exercises also help to relieve pain, numbness, and tingling. Exercise A: Hip rotators  1. Lie on your back on a firm surface. 2. Pull your left / right knee toward your same shoulder with your left / right hand until your knee is pointing toward the ceiling. Hold your left / right ankle with your other hand. 3. Keeping your knee steady, gently pull your left / right ankle toward your other shoulder until you feel a stretch in your buttocks. 4. Hold this position for __________ seconds. Repeat __________ times. Complete this stretch __________ times a day. Exercise B: Hip extensors 1. Lie on your back on a firm surface. Both of your legs should be straight. 2. Pull your left / right knee to your chest. Hold your leg in this position by holding onto the back of your thigh or the front of your knee. 3. Hold this position for __________ seconds. 4. Slowly return to the starting position. Repeat __________ times. Complete this stretch __________ times a day. Strengthening exercises These exercises build strength and endurance in your hip and thigh muscles. Endurance is the ability to use your muscles for a long time, even after they get tired. Exercise C: Straight leg raises (hip abductors)  1. Lie on your side with your left / right leg in the top position. Lie so your head, shoulder, knee, and hip line up. Bend your bottom knee to help you balance. 2. Lift your top leg up 4-6 inches (10-15 cm), keeping your toes  pointed straight ahead. 3. Hold this position for __________ seconds. 4. Slowly lower your leg to the starting position. Let your muscles relax completely. Repeat __________ times. Complete this exercise__________ times a day. Exercise D: Hip abductors and rotators, quadruped  1. Get on your hands and knees on a firm, lightly padded surface. Your hands should be directly below your shoulders, and your knees should be directly below your hips. 2. Lift your left / right knee out to the side. Keep your knee bent. Do not twist your body. 3. Hold this position for __________ seconds. 4. Slowly lower your leg. Repeat __________ times. Complete this exercise__________ times a day. Exercise E: Straight leg raises (hip extensors) 1. Lie on your abdomen on a bed or a firm surface with a pillow under your hips. 2. Squeeze your buttock muscles and lift your left / right thigh off the bed. Do not let your back arch. 3. Hold this position for __________ seconds. 4. Slowly return to the starting position. Let your muscles relax completely before doing another repetition. Repeat __________ times. Complete this exercise__________ times a day. This information is not intended to replace advice given to  you by your health care provider. Make sure you discuss any questions you have with your health care provider. Document Released: 03/23/2005 Document Revised: 11/26/2015 Document Reviewed: 03/05/2015 [employer] Interactive Patient Education  2019 [employer].

## 2018-06-01 ENCOUNTER — Other Ambulatory Visit: Payer: Self-pay | Admitting: Physician Assistant

## 2018-06-01 DIAGNOSIS — R300 Dysuria: Secondary | ICD-10-CM

## 2018-06-01 LAB — LIPID PANEL
Cholesterol: 196 mg/dL (ref <200)
HDL: 54 mg/dL (ref >=50)
LDL Cholesterol (Calc): 120 mg/dL (calc) — ABNORMAL HIGH
Non-HDL Cholesterol (Calc): 142 mg/dL (calc) — ABNORMAL HIGH (ref <130)
Total CHOL/HDL Ratio: 3.6 (calc) (ref <5.0)
Triglycerides: 114 mg/dL (ref <150)

## 2018-06-01 LAB — MICROALBUMIN / CREATININE URINE RATIO
Creatinine, Urine: 184 mg/dL (ref 20–275)
Microalb Creat Ratio: 2 mcg/mg creat (ref <30)
Microalb, Ur: 0.4 mg/dL

## 2018-06-01 LAB — COMPLETE METABOLIC PANEL WITH GFR
AG RATIO: 1.2 (calc) (ref 1.0–2.5)
ALT: 12 U/L (ref 6–29)
AST: 11 U/L (ref 10–30)
Albumin: 4 g/dL (ref 3.6–5.1)
Alkaline phosphatase (APISO): 62 U/L (ref 31–125)
BILIRUBIN TOTAL: 0.3 mg/dL (ref 0.2–1.2)
BUN: 12 mg/dL (ref 7–25)
CHLORIDE: 103 mmol/L (ref 98–110)
CO2: 27 mmol/L (ref 20–32)
Calcium: 9.6 mg/dL (ref 8.6–10.2)
Creat: 1.06 mg/dL (ref 0.50–1.10)
GFR, Est African American: 75 mL/min/{1.73_m2} (ref >=60)
GFR, Est Non African American: 65 mL/min/{1.73_m2} (ref >=60)
Globulin: 3.4 g/dL (calc) (ref 1.9–3.7)
Glucose, Bld: 88 mg/dL (ref 65–99)
POTASSIUM: 4 mmol/L (ref 3.5–5.3)
Sodium: 137 mmol/L (ref 135–146)
TOTAL PROTEIN: 7.4 g/dL (ref 6.1–8.1)

## 2018-06-01 LAB — URINALYSIS, ROUTINE W REFLEX MICROSCOPIC
Bilirubin Urine: NEGATIVE
Glucose, UA: NEGATIVE
Hyaline Cast: NONE SEEN /LPF
KETONES UR: NEGATIVE
LEUKOCYTE UA: NEGATIVE
Nitrite: POSITIVE — AB
Protein, ur: NEGATIVE
Specific Gravity, Urine: 1.019 (ref 1.001–1.03)
pH: <=5 (ref 5.0–8.0)

## 2018-06-01 LAB — CBC WITH DIFFERENTIAL/PLATELET
ABSOLUTE MONOCYTES: 429 {cells}/uL (ref 200–950)
BASOS ABS: 40 {cells}/uL (ref 0–200)
Basophils Relative: 0.6 %
Eosinophils Absolute: 221 cells/uL (ref 15–500)
Eosinophils Relative: 3.3 %
HCT: 31.5 % — ABNORMAL LOW (ref 35.0–45.0)
HEMOGLOBIN: 9.8 g/dL — AB (ref 11.7–15.5)
Lymphs Abs: 3055 cells/uL (ref 850–3900)
MCH: 24.7 pg — ABNORMAL LOW (ref 27.0–33.0)
MCHC: 31.1 g/dL — AB (ref 32.0–36.0)
MCV: 79.5 fL — AB (ref 80.0–100.0)
MPV: 9.9 fL (ref 7.5–12.5)
Monocytes Relative: 6.4 %
NEUTROS ABS: 2955 {cells}/uL (ref 1500–7800)
Neutrophils Relative %: 44.1 %
Platelets: 480 10*3/uL — ABNORMAL HIGH (ref 140–400)
RBC: 3.96 10*6/uL (ref 3.80–5.10)
RDW: 14.1 % (ref 11.0–15.0)
Total Lymphocyte: 45.6 %
WBC: 6.7 10*3/uL (ref 3.8–10.8)

## 2018-06-01 LAB — HEMOGLOBIN A1C
EAG (MMOL/L): 6.6 (calc)
HEMOGLOBIN A1C: 5.8 %{Hb} — AB (ref <5.7)
Mean Plasma Glucose: 120 (calc)

## 2018-06-01 LAB — IRON, TOTAL/TOTAL IRON BINDING CAP
%SAT: 4 % (calc) — ABNORMAL LOW (ref 16–45)
IRON: 17 ug/dL — AB (ref 40–190)
TIBC: 398 mcg/dL (calc) (ref 250–450)

## 2018-06-01 LAB — MAGNESIUM: MAGNESIUM: 1.8 mg/dL (ref 1.5–2.5)

## 2018-06-01 LAB — VITAMIN D 25 HYDROXY (VIT D DEFICIENCY, FRACTURES): Vit D, 25-Hydroxy: 24 ng/mL — ABNORMAL LOW (ref 30–100)

## 2018-06-01 LAB — TSH: TSH: 1.61 mIU/L

## 2018-06-01 LAB — VITAMIN B12: Vitamin B-12: 487 pg/mL (ref 200–1100)

## 2018-06-01 NOTE — Addendum Note (Signed)
Addended by: Dolores Hoose on: 06/01/2018 01:18 PM   Modules accepted: Orders

## 2018-06-02 LAB — URINE CULTURE
MICRO NUMBER:: 247397
SPECIMEN QUALITY:: ADEQUATE

## 2018-06-03 ENCOUNTER — Other Ambulatory Visit: Payer: Self-pay | Admitting: Physician Assistant

## 2018-06-03 MED ORDER — SULFAMETHOXAZOLE-TRIMETHOPRIM 800-160 MG PO TABS: 1.0000 | ORAL_TABLET | Freq: Two times a day (BID) | ORAL | 0 refills | Status: DC

## 2018-08-19 ENCOUNTER — Other Ambulatory Visit: Payer: Self-pay | Admitting: Physician Assistant

## 2018-10-27 ENCOUNTER — Other Ambulatory Visit: Payer: Self-pay | Admitting: Internal Medicine

## 2018-10-27 DIAGNOSIS — I10 Essential (primary) hypertension: Secondary | ICD-10-CM

## 2018-10-27 MED ORDER — BISOPROLOL-HYDROCHLOROTHIAZIDE 2.5-6.25 MG PO TABS: ORAL_TABLET | ORAL | 1 refills | Status: DC

## 2019-05-10 ENCOUNTER — Other Ambulatory Visit: Payer: Self-pay | Admitting: Internal Medicine

## 2019-05-10 DIAGNOSIS — I10 Essential (primary) hypertension: Secondary | ICD-10-CM

## 2019-05-10 MED ORDER — BISOPROLOL-HYDROCHLOROTHIAZIDE 2.5-6.25 MG PO TABS: ORAL_TABLET | ORAL | 0 refills | Status: DC

## 2019-06-06 ENCOUNTER — Encounter: Payer: Self-pay | Admitting: Physician Assistant

## 2019-06-06 ENCOUNTER — Ambulatory Visit (INDEPENDENT_AMBULATORY_CARE_PROVIDER_SITE_OTHER): Payer: BC Managed Care – PPO | Admitting: Adult Health Nurse Practitioner

## 2019-06-06 ENCOUNTER — Encounter: Payer: Self-pay | Admitting: Adult Health Nurse Practitioner

## 2019-06-06 ENCOUNTER — Other Ambulatory Visit: Payer: Self-pay

## 2019-06-06 VITALS — BP 122/82 | HR 78 | Temp 97.9°F | Ht 62.0 in | Wt 195.4 lb

## 2019-06-06 DIAGNOSIS — Z131 Encounter for screening for diabetes mellitus: Secondary | ICD-10-CM | POA: Diagnosis not present

## 2019-06-06 DIAGNOSIS — Z0000 Encounter for general adult medical examination without abnormal findings: Secondary | ICD-10-CM

## 2019-06-06 DIAGNOSIS — I10 Essential (primary) hypertension: Secondary | ICD-10-CM | POA: Diagnosis not present

## 2019-06-06 DIAGNOSIS — Z1329 Encounter for screening for other suspected endocrine disorder: Secondary | ICD-10-CM

## 2019-06-06 DIAGNOSIS — J309 Allergic rhinitis, unspecified: Secondary | ICD-10-CM

## 2019-06-06 DIAGNOSIS — Z13220 Encounter for screening for lipoid disorders: Secondary | ICD-10-CM

## 2019-06-06 DIAGNOSIS — R7303 Prediabetes: Secondary | ICD-10-CM

## 2019-06-06 DIAGNOSIS — Z130 Encounter for screening for diseases of the blood and blood-forming organs and certain disorders involving the immune mechanism: Secondary | ICD-10-CM | POA: Diagnosis not present

## 2019-06-06 DIAGNOSIS — Z79899 Other long term (current) drug therapy: Secondary | ICD-10-CM

## 2019-06-06 DIAGNOSIS — Z1389 Encounter for screening for other disorder: Secondary | ICD-10-CM

## 2019-06-06 DIAGNOSIS — E782 Mixed hyperlipidemia: Secondary | ICD-10-CM

## 2019-06-06 DIAGNOSIS — Z136 Encounter for screening for cardiovascular disorders: Secondary | ICD-10-CM

## 2019-06-06 DIAGNOSIS — E559 Vitamin D deficiency, unspecified: Secondary | ICD-10-CM

## 2019-06-06 DIAGNOSIS — F3341 Major depressive disorder, recurrent, in partial remission: Secondary | ICD-10-CM

## 2019-06-06 DIAGNOSIS — Z3009 Encounter for other general counseling and advice on contraception: Secondary | ICD-10-CM

## 2019-06-06 NOTE — Patient Instructions (Addendum)
We will respond with your lab results in 1-3 days via [place].  We are going to check an iron panel as well as your vitamin D.  After labs results we will talk about your regiment going forward.   Constipation: Increase water intake Increasing fiber will also help.  You can get this from vegetables, high fiber cereals, prune juice.  Increasing activity can also help with this, walking If diet/activity adjustments alone are not working try one of the following below.  You may purchase these at any local pharmacy.  Colace (ducosate sodium) 145m tablet once a day.  This is a stool softener, not a stimulant.  So this will not cause you to rush to the bathroom.  If you find everyday is too much, try every other or every third day.  This will help as the iron you take can be constipating.   Miralax is another product, powder that can be mixed with water, juice or coffee.  Try once a day, if too much every other.  This medicine works to add bulk, water, to your stool to reduce straining, not a stimulant.   Constipation, Adult Constipation is when a person:  Poops (has a bowel movement) fewer times in a week than normal.  Has a hard time pooping.  Has poop that is dry, hard, or bigger than normal. Follow these instructions at home: Eating and drinking   Eat foods that have a lot of fiber, such as: ? Fresh fruits and vegetables. ? Whole grains. ? Beans.  Eat less of foods that are high in fat, low in fiber, or overly processed, such as: ? [place]. ? Hamburgers. ? Cookies. ? Candy. ? Soda.  Drink enough fluid to keep your pee (urine) clear or pale yellow. General instructions  Exercise regularly or as told by your doctor.  Go to the restroom when you feel like you need to poop. Do not hold it in.  Take over-the-counter and prescription medicines only as told by your doctor. These include any fiber supplements.  Do pelvic floor retraining exercises, such as: ? Doing deep  breathing while relaxing your lower belly (abdomen). ? Relaxing your pelvic floor while pooping.  Watch your condition for any changes.  Keep all follow-up visits as told by your doctor. This is important. Contact a doctor if:  You have pain that gets worse.  You have a fever.  You have not pooped for 4 days.  You throw up (vomit).  You are not hungry.  You lose weight.  You are bleeding from the anus.  You have thin, pencil-like poop (stool). Get help right away if:  You have a fever, and your symptoms suddenly get worse.  You leak poop or have blood in your poop.  Your belly feels hard or bigger than normal (is bloated).  You have very bad belly pain.  You feel dizzy or you faint. This information is not intended to replace advice given to you by your health care provider. Make sure you discuss any questions you have with your health care provider. Document Released: 09/09/2007 Document Revised: 03/05/2017 Document Reviewed: 09/11/2015 Elsevier Patient Education  2Ford1,000 mg   are recommended to help protect  against the Covid-19 and other Corona viruses.    Also it's recommended  to take  Zinc 50 mg  to help  protect against the Covid-19   and best place to get  is  also on [employer]com  and don't pay more than 6-8 cents /pill !  ================================ Coronavirus (COVID-19) Are you at risk?  Are you at risk for the Coronavirus (COVID-19)?  To be considered HIGH RISK for Coronavirus (COVID-19), you have to meet the following criteria:  . Traveled to [place], [place], [place], [place] or [place]; or in the [place] to [place], [place], [place]  . or [place]; and have fever, cough, and shortness of breath within the last 2 weeks of travel OR . Been in close contact with a person diagnosed with COVID-19 within the last 2 weeks and have  . fever, cough,and shortness of breath .  . IF YOU DO NOT MEET  THESE CRITERIA, YOU ARE CONSIDERED LOW RISK FOR COVID-19.  What to do if you are HIGH RISK for COVID-19?  Marland Kitchen If you are having a medical emergency, call 911. . Seek medical care right away. Before you go to a doctor's office, urgent care or emergency department, .  call ahead and tell them about your recent travel, contact with someone diagnosed with COVID-19  .  and your symptoms.  . You should receive instructions from your physician's office regarding next steps of care.  . When you arrive at healthcare provider, tell the healthcare staff immediately you have returned from  . visiting [place], [place], [place], [place] or [place]; or traveled in the [place] to [place], [place],  . [place] or [place] in the last two weeks or you have been in close contact with a person diagnosed with  . COVID-19 in the last 2 weeks.   . Tell the health care staff about your symptoms: fever, cough and shortness of breath. . After you have been seen by a medical provider, you will be either: o Tested for (COVID-19) and discharged home on quarantine except to seek medical care if  o symptoms worsen, and asked to  - Stay home and avoid contact with others until you get your results (4-5 days)  - Avoid travel on public transportation if possible (such as bus, train, or airplane) or o Sent to the Emergency Department by EMS for evaluation, COVID-19 testing  and  o possible admission depending on your condition and test results.  What to do if you are LOW RISK for COVID-19?  Reduce your risk of any infection by using the same precautions used for avoiding the common cold or flu:  Marland Kitchen Wash your hands often with soap and warm water for at least 20 seconds.  If soap and water are not readily available,  . use an alcohol-based hand sanitizer with at least 60% alcohol.  . If coughing or sneezing, cover your mouth and nose by coughing or sneezing into the elbow areas of your shirt or coat, .  into a tissue  or into your sleeve (not your hands). . Avoid shaking hands with others and consider head nods or verbal greetings only. . Avoid touching your eyes, nose, or mouth with unwashed hands.  . Avoid close contact with people who are sick. . Avoid places or events with large numbers of people in one location, like concerts or sporting events. . Carefully consider travel plans you have or are making. . If you are planning any travel outside or inside the [place], visit the CDC's Travelers' Health webpage for the latest health notices. . If you have some symptoms but not all symptoms, continue to monitor at home and seek medical attention  .  if your symptoms worsen. . If you are having a medical emergency, call 911. >>>>>>>>>>>>>>>>>>>>>>> Preventive Care for Adults  A healthy lifestyle and preventive care can promote health and wellness. Preventive health guidelines for women include the following key practices.  A routine yearly physical is a good way to check with your health care provider about your health and preventive screening. It is a chance to share any concerns and updates on your health and to receive a thorough exam.  Visit your dentist for a routine exam and preventive care every 6 months. Brush your teeth twice a day and floss once a day. Good oral hygiene prevents tooth decay and gum disease.  The frequency of eye exams is based on your age, health, family medical history, use of contact lenses, and other factors. Follow your health care provider's recommendations for frequency of eye exams.  Eat a healthy diet. Foods like vegetables, fruits, whole grains, low-fat dairy products, and lean protein foods contain the nutrients you need without too many calories. Decrease your intake of foods high in solid fats, added sugars, and salt. Eat the right amount of calories for you. Get information about a proper diet from your health care provider, if necessary.  Regular physical exercise is one of  the most important things you can do for your health. Most adults should get at least 150 minutes of moderate-intensity exercise (any activity that increases your heart rate and causes you to sweat) each week. In addition, most adults need muscle-strengthening exercises on 2 or more days a week.  Maintain a healthy weight. The body mass index (BMI) is a screening tool to identify possible weight problems. It provides an estimate of body fat based on height and weight. Your health care provider can find your BMI and can help you achieve or maintain a healthy weight. For adults 20 years and older:  A BMI below 18.5 is considered underweight.  A BMI of 18.5 to 24.9 is normal.  A BMI of 25 to 29.9 is considered overweight.  A BMI of 30 and above is considered obese.  Maintain normal blood lipids and cholesterol levels by exercising and minimizing your intake of saturated fat. Eat a balanced diet with plenty of fruit and vegetables. Blood tests for lipids and cholesterol should begin at age 46 and be repeated every 5 years. If your lipid or cholesterol levels are high, you are over 50, or you are at high risk for heart disease, you may need your cholesterol levels checked more frequently. Ongoing high lipid and cholesterol levels should be treated with medicines if diet and exercise are not working.  If you smoke, find out from your health care provider how to quit. If you do not use tobacco, do not start.  Lung cancer screening is recommended for adults aged 21-80 years who are at high risk for developing lung cancer because of a history of smoking. A yearly low-dose CT scan of the lungs is recommended for people who have at least a 30-pack-year history of smoking and are a current smoker or have quit within the past 15 years. A pack year of smoking is smoking an average of 1 pack of cigarettes a day for 1 year (for example: 1 pack a day for 30 years or 2 packs a day for 15 years). Yearly screening  should continue until the smoker has stopped smoking for at least 15 years. Yearly screening should be stopped for people who develop a health problem that would  prevent them from having lung cancer treatment.  High blood pressure causes heart disease and increases the risk of stroke. Your blood pressure should be checked at least every 1 to 2 years. Ongoing high blood pressure should be treated with medicines if weight loss and exercise do not work.  If you are 69-57 years old, ask your health care provider if you should take aspirin to prevent strokes.  Diabetes screening involves taking a blood sample to check your fasting blood sugar level. This should be done once every 3 years, after age 16, if you are within normal weight and without risk factors for diabetes. Testing should be considered at a younger age or be carried out more frequently if you are overweight and have at least 1 risk factor for diabetes.  Breast cancer screening is essential preventive care for women. You should practice "breast self-awareness." This means understanding the normal appearance and feel of your breasts and may include breast self-examination. Any changes detected, no matter how small, should be reported to a health care provider. Women in their 68s and 30s should have a clinical breast exam (CBE) by a health care provider as part of a regular health exam every 1 to 3 years. After age 16, women should have a CBE every year. Starting at age 12, women should consider having a mammogram (breast X-ray test) every year. Women who have a family history of breast cancer should talk to their health care provider about genetic screening. Women at a high risk of breast cancer should talk to their health care providers about having an MRI and a mammogram every year.  Breast cancer gene (BRCA)-related cancer risk assessment is recommended for women who have family members with BRCA-related cancers. BRCA-related cancers include  breast, ovarian, tubal, and peritoneal cancers. Having family members with these cancers may be associated with an increased risk for harmful changes (mutations) in the breast cancer genes BRCA1 and BRCA2. Results of the assessment will determine the need for genetic counseling and BRCA1 and BRCA2 testing.  Routine pelvic exams to screen for cancer are no longer recommended for nonpregnant women who are considered low risk for cancer of the pelvic organs (ovaries, uterus, and vagina) and who do not have symptoms. Ask your health care provider if a screening pelvic exam is right for you.  If you have had past treatment for cervical cancer or a condition that could lead to cancer, you need Pap tests and screening for cancer for at least 20 years after your treatment. If Pap tests have been discontinued, your risk factors (such as having a new sexual partner) need to be reassessed to determine if screening should be resumed. Some women have medical problems that increase the chance of getting cervical cancer. In these cases, your health care provider may recommend more frequent screening and Pap tests.  Colorectal cancer can be detected and often prevented. Most routine colorectal cancer screening begins at the age of 64 years and continues through age 5 years. However, your health care provider may recommend screening at an earlier age if you have risk factors for colon cancer. On a yearly basis, your health care provider may provide home test kits to check for hidden blood in the stool. Use of a small camera at the end of a tube, to directly examine the colon (sigmoidoscopy or colonoscopy), can detect the earliest forms of colorectal cancer. Talk to your health care provider about this at age 13, when routine screening begins.  Direct  exam of the colon should be repeated every 5-10 years through age 71 years, unless early forms of pre-cancerous polyps or small growths are found.  Hepatitis C blood testing is  recommended for all people born from [birth year] through 1965 and any individual with known risks for hepatitis C.  Pra  Osteoporosis is a disease in which the bones lose minerals and strength with aging. This can result in serious bone fractures or breaks. The risk of osteoporosis can be identified using a bone density scan. Women ages 62 years and over and women at risk for fractures or osteoporosis should discuss screening with their health care providers. Ask your health care provider whether you should take a calcium supplement or vitamin D to reduce the rate of osteoporosis.  Menopause can be associated with physical symptoms and risks. Hormone replacement therapy is available to decrease symptoms and risks. You should talk to your health care provider about whether hormone replacement therapy is right for you.  Use sunscreen. Apply sunscreen liberally and repeatedly throughout the day. You should seek shade when your shadow is shorter than you. Protect yourself by wearing long sleeves, pants, a wide-brimmed hat, and sunglasses year round, whenever you are outdoors.  Once a month, do a whole body skin exam, using a mirror to look at the skin on your back. Tell your health care provider of new moles, moles that have irregular borders, moles that are larger than a pencil eraser, or moles that have changed in shape or color.  Stay current with required vaccines (immunizations).  Influenza vaccine. All adults should be immunized every year.  Tetanus, diphtheria, and acellular pertussis (Td, Tdap) vaccine. Pregnant women should receive 1 dose of Tdap vaccine during each pregnancy. The dose should be obtained regardless of the length of time since the last dose. Immunization is preferred during the 27th-36th week of gestation. An adult who has not previously received Tdap or who does not know her vaccine status should receive 1 dose of Tdap. This initial dose should be followed by tetanus and diphtheria  toxoids (Td) booster doses every 10 years. Adults with an unknown or incomplete history of completing a 3-dose immunization series with Td-containing vaccines should begin or complete a primary immunization series including a Tdap dose. Adults should receive a Td booster every 10 years.  Varicella vaccine. An adult without evidence of immunity to varicella should receive 2 doses or a second dose if she has previously received 1 dose. Pregnant females who do not have evidence of immunity should receive the first dose after pregnancy. This first dose should be obtained before leaving the health care facility. The second dose should be obtained 4-8 weeks after the first dose.  Human papillomavirus (HPV) vaccine. Females aged 13-26 years who have not received the vaccine previously should obtain the 3-dose series. The vaccine is not recommended for use in pregnant females. However, pregnancy testing is not needed before receiving a dose. If a female is found to be pregnant after receiving a dose, no treatment is needed. In that case, the remaining doses should be delayed until after the pregnancy. Immunization is recommended for any person with an immunocompromised condition through the age of 84 years if she did not get any or all doses earlier. During the 3-dose series, the second dose should be obtained 4-8 weeks after the first dose. The third dose should be obtained 24 weeks after the first dose and 16 weeks after the second dose.  Zoster vaccine. One dose  is recommended for adults aged 77 years or older unless certain conditions are present.  Measles, mumps, and rubella (MMR) vaccine. Adults born before [birth year] generally are considered immune to measles and mumps. Adults born in [birth year] or later should have 1 or more doses of MMR vaccine unless there is a contraindication to the vaccine or there is laboratory evidence of immunity to each of the three diseases. A routine second dose of MMR vaccine should be  obtained at least 28 days after the first dose for students attending postsecondary schools, health care workers, or international travelers. People who received inactivated measles vaccine or an unknown type of measles vaccine during 1963-1967 should receive 2 doses of MMR vaccine. People who received inactivated mumps vaccine or an unknown type of mumps vaccine before 1979 and are at high risk for mumps infection should consider immunization with 2 doses of MMR vaccine. For females of childbearing age, rubella immunity should be determined. If there is no evidence of immunity, females who are not pregnant should be vaccinated. If there is no evidence of immunity, females who are pregnant should delay immunization until after pregnancy. Unvaccinated health care workers born before [birth year] who lack laboratory evidence of measles, mumps, or rubella immunity or laboratory confirmation of disease should consider measles and mumps immunization with 2 doses of MMR vaccine or rubella immunization with 1 dose of MMR vaccine.  Pneumococcal 13-valent conjugate (PCV13) vaccine. When indicated, a person who is uncertain of her immunization history and has no record of immunization should receive the PCV13 vaccine. An adult aged 75 years or older who has certain medical conditions and has not been previously immunized should receive 1 dose of PCV13 vaccine. This PCV13 should be followed with a dose of pneumococcal polysaccharide (PPSV23) vaccine. The PPSV23 vaccine dose should be obtained at least 1 or more year(s) after the dose of PCV13 vaccine. An adult aged 37 years or older who has certain medical conditions and previously received 1 or more doses of PPSV23 vaccine should receive 1 dose of PCV13. The PCV13 vaccine dose should be obtained 1 or more years after the last PPSV23 vaccine dose.    Pneumococcal polysaccharide (PPSV23) vaccine. When PCV13 is also indicated, PCV13 should be obtained first. All adults aged 33  years and older should be immunized. An adult younger than age 80 years who has certain medical conditions should be immunized. Any person who resides in a nursing home or long-term care facility should be immunized. An adult smoker should be immunized. People with an immunocompromised condition and certain other conditions should receive both PCV13 and PPSV23 vaccines. People with human immunodeficiency virus (HIV) infection should be immunized as soon as possible after diagnosis. Immunization during chemotherapy or radiation therapy should be avoided. Routine use of PPSV23 vaccine is not recommended for American Indians, [place] Natives, or people younger than 65 years unless there are medical conditions that require PPSV23 vaccine. When indicated, people who have unknown immunization and have no record of immunization should receive PPSV23 vaccine. One-time revaccination 5 years after the first dose of PPSV23 is recommended for people aged 19-64 years who have chronic kidney failure, nephrotic syndrome, asplenia, or immunocompromised conditions. People who received 1-2 doses of PPSV23 before age 68 years should receive another dose of PPSV23 vaccine at age 43 years or later if at least 5 years have passed since the previous dose. Doses of PPSV23 are not needed for people immunized with PPSV23 at or after age 7 years.  Preventive  Services / Frequency   Ages 97 to 58 years  Blood pressure check.  Lipid and cholesterol check.  Lung cancer screening. / Every year if you are aged 49-80 years and have a 30-pack-year history of smoking and currently smoke or have quit within the past 15 years. Yearly screening is stopped once you have quit smoking for at least 15 years or develop a health problem that would prevent you from having lung cancer treatment.  Clinical breast exam.** / Every year after age 34 years.   BRCA-related cancer risk assessment.** / For women who have family members with a BRCA-related  cancer (breast, ovarian, tubal, or peritoneal cancers).  Mammogram.** / Every year beginning at age 34 years and continuing for as long as you are in good health. Consult with your health care provider.  Pap test.** / Every 3 years starting at age 20 years through age 63 or 67 years with a history of 3 consecutive normal Pap tests.  HPV screening.** / Every 3 years from ages 13 years through ages 9 to 5 years with a history of 3 consecutive normal Pap tests.  Fecal occult blood test (FOBT) of stool. / Every year beginning at age 24 years and continuing until age 42 years. You may not need to do this test if you get a colonoscopy every 10 years.  Flexible sigmoidoscopy or colonoscopy.** / Every 5 years for a flexible sigmoidoscopy or every 10 years for a colonoscopy beginning at age 70 years and continuing until age 65 years.  Hepatitis C blood test.** / For all people born from [birth year] through 1965 and any individual with known risks for hepatitis C.  Skin self-exam. / Monthly.  Influenza vaccine. / Every year.  Tetanus, diphtheria, and acellular pertussis (Tdap/Td) vaccine.** / Consult your health care provider. Pregnant women should receive 1 dose of Tdap vaccine during each pregnancy. 1 dose of Td every 10 years.  Varicella vaccine.** / Consult your health care provider. Pregnant females who do not have evidence of immunity should receive the first dose after pregnancy.  Zoster vaccine.** / 1 dose for adults aged 60 years or older.  Pneumococcal 13-valent conjugate (PCV13) vaccine.** / Consult your health care provider.  Pneumococcal polysaccharide (PPSV23) vaccine.** / 1 to 2 doses if you smoke cigarettes or if you have certain conditions.  Meningococcal vaccine.** / Consult your health care provider.  Hepatitis A vaccine.** / Consult your health care provider.  Hepatitis B vaccine.** / Consult your health care provider. Screening for abdominal aortic aneurysm (AAA)  by ultrasound  is recommended for people over 50 who have history of high blood pressure or who are current or former smokers. ++++++++++++++++++ Recommend Adult Low Dose Aspirin or  coated  Aspirin 81 mg daily  To reduce risk of Colon Cancer 40 %,  Skin Cancer 26 % ,  Melanoma 46%  and  Pancreatic cancer 60% +++++++++++++++++++ Vitamin D goal  is between 70-100.  Please make sure that you are taking your Vitamin D as directed.  It is very important as a natural anti-inflammatory  helping hair, skin, and nails, as well as reducing stroke and heart attack risk.  It helps your bones and helps with mood. It also decreases numerous cancer risks so please take it as directed.  Low Vit D is associated with a 200-300% higher risk for CANCER  and 200-300% higher risk for HEART   ATTACK  &  STROKE.   .....................................Marland Kitchen It is also associated with higher death rate  at younger ages,  autoimmune diseases like Rheumatoid arthritis, Lupus, Multiple Sclerosis.    Also many other serious conditions, like depression, Alzheimer's Dementia, infertility, muscle aches, fatigue, fibromyalgia - just to name a few. ++++++++++++++++++ Recommend the book "The END of DIETING" by Dr Excell Seltzer  & the book "The END of DIABETES " by Dr Excell Seltzer At [email or web address] - get book & Audio CD's    Being diabetic has a  300% increased risk for heart attack, stroke, cancer, and alzheimer- type vascular dementia. It is very important that you work harder with diet by avoiding all foods that are white. Avoid white rice (brown & wild rice is OK), white potatoes (sweetpotatoes in moderation is OK), White bread or wheat bread or anything made out of white flour like bagels, donuts, rolls, buns, biscuits, cakes, pastries, cookies, pizza crust, and pasta (made from white flour & egg whites) - vegetarian pasta or spinach or wheat pasta is OK. Multigrain breads like Arnold's or Pepperidge [employer], or multigrain sandwich thins or  flatbreads.  Diet, exercise and weight loss can reverse and cure diabetes in the early stages.  Diet, exercise and weight loss is very important in the control and prevention of complications of diabetes which affects every system in your body, ie. Brain - dementia/stroke, eyes - glaucoma/blindness, heart - heart attack/heart failure, kidneys - dialysis, stomach - gastric paralysis, intestines - malabsorption, nerves - severe painful neuritis, circulation - gangrene & loss of a leg(s), and finally cancer and Alzheimers.    I recommend avoid fried & greasy foods,  sweets/candy, white rice (brown or wild rice or Quinoa is OK), white potatoes (sweet potatoes are OK) - anything made from white flour - bagels, doughnuts, rolls, buns, biscuits,white and wheat breads, pizza crust and traditional pasta made of white flour & egg white(vegetarian pasta or spinach or wheat pasta is OK).  Multi-grain bread is OK - like multi-grain flat bread or sandwich thins. Avoid alcohol in excess. Exercise is also important.    Eat all the vegetables you want - avoid meat, especially red meat and dairy - especially cheese.  Cheese is the most concentrated form of trans-fats which is the worst thing to clog up our arteries. Veggie cheese is OK which can be found in the fresh produce section at [employer] or [employer] or [employer]  ++++++++++++++++++++++ DASH Eating Plan  DASH stands for "Dietary Approaches to Stop Hypertension."   The DASH eating plan is a healthy eating plan that has been shown to reduce high blood pressure (hypertension). Additional health benefits may include reducing the risk of type 2 diabetes mellitus, heart disease, and stroke. The DASH eating plan may also help with weight loss. WHAT DO I NEED TO KNOW ABOUT THE DASH EATING PLAN? For the DASH eating plan, you will follow these general guidelines:  Choose foods with a percent daily value for sodium of less than 5% (as listed on the food  label).  Use salt-free seasonings or herbs instead of table salt or sea salt.  Check with your health care provider or pharmacist before using salt substitutes.  Eat lower-sodium products, often labeled as "lower sodium" or "no salt added."  Eat fresh foods.  Eat more vegetables, fruits, and low-fat dairy products.  Choose whole grains. Look for the word "whole" as the first word in the ingredient list.  Choose fish   Limit sweets, desserts, sugars, and sugary drinks.  Choose heart-healthy fats.  Eat veggie cheese   Eat more home-cooked  food and less restaurant, buffet, and fast food.  Limit fried foods.  Cook foods using methods other than frying.  Limit canned vegetables. If you do use them, rinse them well to decrease the sodium.  When eating at a restaurant, ask that your food be prepared with less salt, or no salt if possible.                      WHAT FOODS CAN I EAT? Read Dr Fara Olden Fuhrman's books on The End of Dieting & The End of Diabetes  Grains Whole grain or whole wheat bread. Brown rice. Whole grain or whole wheat pasta. Quinoa, bulgur, and whole grain cereals. Low-sodium cereals. Corn or whole wheat flour tortillas. Whole grain cornbread. Whole grain crackers. Low-sodium crackers.  Vegetables Fresh or frozen vegetables (raw, steamed, roasted, or grilled). Low-sodium or reduced-sodium tomato and vegetable juices. Low-sodium or reduced-sodium tomato sauce and paste. Low-sodium or reduced-sodium canned vegetables.   Fruits All fresh, canned (in natural juice), or frozen fruits.  Protein Products  All fish and seafood.  Dried beans, peas, or lentils. Unsalted nuts and seeds. Unsalted canned beans.  Dairy Low-fat dairy products, such as skim or 1% milk, 2% or reduced-fat cheeses, low-fat ricotta or cottage cheese, or plain low-fat yogurt. Low-sodium or reduced-sodium cheeses.  Fats and Oils Tub margarines without trans fats. Light or reduced-fat mayonnaise  and salad dressings (reduced sodium). Avocado. Safflower, olive, or canola oils. Natural peanut or almond butter.  Other Unsalted popcorn and pretzels. The items listed above may not be a complete list of recommended foods or beverages. Contact your dietitian for more options.  ++++++++++++++++++  WHAT FOODS ARE NOT RECOMMENDED? Grains/ White flour or wheat flour White bread. White pasta. White rice. Refined cornbread. Bagels and croissants. Crackers that contain trans fat.  Vegetables  Creamed or fried vegetables. Vegetables in a . Regular canned vegetables. Regular canned tomato sauce and paste. Regular tomato and vegetable juices.  Fruits Dried fruits. Canned fruit in light or heavy syrup. Fruit juice.  Meat and Other Protein Products Meat in general - RED meat & White meat.  Fatty cuts of meat. Ribs, chicken wings, all processed meats as bacon, sausage, bologna, salami, fatback, hot dogs, bratwurst and packaged luncheon meats.  Dairy Whole or 2% milk, cream, half-and-half, and cream cheese. Whole-fat or sweetened yogurt. Full-fat cheeses or blue cheese. Non-dairy creamers and whipped toppings. Processed cheese, cheese spreads, or cheese curds.  Condiments Onion and garlic salt, seasoned salt, table salt, and sea salt. Canned and packaged gravies. Worcestershire sauce. Tartar sauce. Barbecue sauce. Teriyaki sauce. Soy sauce, including reduced sodium. Steak sauce. Fish sauce. Oyster sauce. Cocktail sauce. Horseradish. Ketchup and mustard. Meat flavorings and tenderizers. Bouillon cubes. Hot sauce. Tabasco sauce. Marinades. Taco seasonings. Relishes.  Fats and Oils Butter, stick margarine, lard, shortening and bacon fat. Coconut, palm kernel, or palm oils. Regular salad dressings.  Pickles and olives. Salted popcorn and pretzels.  The items listed above may not be a complete list of foods and beverages to avoid.
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
## 2019-06-07 LAB — CBC WITH DIFFERENTIAL/PLATELET
Absolute Monocytes: 346 cells/uL (ref 200–950)
Basophils Absolute: 43 cells/uL (ref 0–200)
Basophils Relative: 0.6 %
Eosinophils Absolute: 108 cells/uL (ref 15–500)
Eosinophils Relative: 1.5 %
HCT: 35.1 % (ref 35.0–45.0)
Hemoglobin: 10.5 g/dL — ABNORMAL LOW (ref 11.7–15.5)
Lymphs Abs: 3334 cells/uL (ref 850–3900)
MCH: 23.9 pg — ABNORMAL LOW (ref 27.0–33.0)
MCHC: 29.9 g/dL — ABNORMAL LOW (ref 32.0–36.0)
MCV: 79.8 fL — ABNORMAL LOW (ref 80.0–100.0)
MPV: 9.5 fL (ref 7.5–12.5)
Monocytes Relative: 4.8 %
Neutro Abs: 3370 cells/uL (ref 1500–7800)
Neutrophils Relative %: 46.8 %
Platelets: 474 10*3/uL — ABNORMAL HIGH (ref 140–400)
RBC: 4.4 10*6/uL (ref 3.80–5.10)
RDW: 14.3 % (ref 11.0–15.0)
Total Lymphocyte: 46.3 %
WBC: 7.2 10*3/uL (ref 3.8–10.8)

## 2019-06-07 LAB — LIPID PANEL
Cholesterol: 195 mg/dL
HDL: 37 mg/dL — ABNORMAL LOW
LDL Cholesterol (Calc): 132 mg/dL — ABNORMAL HIGH
Non-HDL Cholesterol (Calc): 158 mg/dL — ABNORMAL HIGH
Total CHOL/HDL Ratio: 5.3 (calc) — ABNORMAL HIGH
Triglycerides: 151 mg/dL — ABNORMAL HIGH

## 2019-06-07 LAB — COMPLETE METABOLIC PANEL WITHOUT GFR
AG Ratio: 1.2 (calc) (ref 1.0–2.5)
ALT: 34 U/L — ABNORMAL HIGH (ref 6–29)
AST: 19 U/L (ref 10–30)
Albumin: 3.9 g/dL (ref 3.6–5.1)
Alkaline phosphatase (APISO): 45 U/L (ref 31–125)
BUN: 10 mg/dL (ref 7–25)
CO2: 25 mmol/L (ref 20–32)
Calcium: 9.3 mg/dL (ref 8.6–10.2)
Chloride: 103 mmol/L (ref 98–110)
Creat: 1.04 mg/dL (ref 0.50–1.10)
GFR, Est African American: 76 mL/min/1.73m2
GFR, Est Non African American: 66 mL/min/1.73m2
Globulin: 3.2 g/dL (ref 1.9–3.7)
Glucose, Bld: 100 mg/dL — ABNORMAL HIGH (ref 65–99)
Potassium: 3.8 mmol/L (ref 3.5–5.3)
Sodium: 137 mmol/L (ref 135–146)
Total Bilirubin: 0.3 mg/dL (ref 0.2–1.2)
Total Protein: 7.1 g/dL (ref 6.1–8.1)

## 2019-06-07 LAB — MICROALBUMIN / CREATININE URINE RATIO
Creatinine, Urine: 167 mg/dL (ref 20–275)
Microalb Creat Ratio: 4 mcg/mg creat (ref <30)
Microalb, Ur: 0.7 mg/dL

## 2019-06-07 LAB — URINALYSIS W MICROSCOPIC + REFLEX CULTURE
Bacteria, UA: NONE SEEN /HPF
Bilirubin Urine: NEGATIVE
Glucose, UA: NEGATIVE
Hyaline Cast: NONE SEEN /LPF
Ketones, ur: NEGATIVE
Leukocyte Esterase: NEGATIVE
Nitrites, Initial: NEGATIVE
Protein, ur: NEGATIVE
RBC / HPF: >=60 /HPF — AB (ref 0–2)
Specific Gravity, Urine: 1.019 (ref 1.001–1.03)
Squamous Epithelial / HPF: NONE SEEN /HPF (ref <=5)
WBC, UA: NONE SEEN /HPF (ref 0–5)
pH: 5.5 (ref 5.0–8.0)

## 2019-06-07 LAB — IRON, TOTAL/TOTAL IRON BINDING CAP
%SAT: 4 % (calc) — ABNORMAL LOW (ref 16–45)
Iron: 17 ug/dL — ABNORMAL LOW (ref 40–190)
TIBC: 400 mcg/dL (calc) (ref 250–450)

## 2019-06-07 LAB — HEMOGLOBIN A1C
Hgb A1c MFr Bld: 6 % of total Hgb — ABNORMAL HIGH (ref <5.7)
Mean Plasma Glucose: 126 (calc)
eAG (mmol/L): 7 (calc)

## 2019-06-07 LAB — MAGNESIUM: Magnesium: 2 mg/dL (ref 1.5–2.5)

## 2019-06-07 LAB — TSH: TSH: 1.62 mIU/L

## 2019-06-07 LAB — NO CULTURE INDICATED

## 2019-06-07 LAB — VITAMIN D 25 HYDROXY (VIT D DEFICIENCY, FRACTURES): Vit D, 25-Hydroxy: 32 ng/mL (ref 30–100)

## 2019-06-19 ENCOUNTER — Other Ambulatory Visit: Payer: Self-pay | Admitting: Internal Medicine

## 2019-06-22 ENCOUNTER — Other Ambulatory Visit: Payer: Self-pay | Admitting: Adult Health Nurse Practitioner

## 2019-06-22 DIAGNOSIS — E782 Mixed hyperlipidemia: Secondary | ICD-10-CM

## 2019-06-22 DIAGNOSIS — I10 Essential (primary) hypertension: Secondary | ICD-10-CM

## 2019-06-22 MED ORDER — BISOPROLOL-HYDROCHLOROTHIAZIDE 2.5-6.25 MG PO TABS: ORAL_TABLET | ORAL | 0 refills | Status: DC

## 2019-06-22 MED ORDER — SIMVASTATIN 20 MG PO TABS: ORAL_TABLET | ORAL | 4 refills | Status: DC

## 2019-06-22 NOTE — Progress Notes (Signed)
06/22/19-Spoke with patient via telephone, two identifiers confirmed.  Discussed lab results with patient.  Concern for blood in urine, she has had evaluation for abnormal uterine bleeding with Dr Alwyn Pea.  She has scheduled laparoscopic hysterectomy for 09/2019.  Discussed her low hemoglobin and importance of taking iron supplementation.  Take with Vitamin C to help with absorption.  Discussed cholesterol and she has agreeable to start Simvastatin 20mg  nightly.  Refill of Ziac sent.  Patient to follow up in three months.  Garnet Sierras, NP [facility] 06/22/2019  1:38 PM

## 2019-07-23 ENCOUNTER — Other Ambulatory Visit: Payer: Self-pay | Admitting: Adult Health Nurse Practitioner

## 2019-07-23 DIAGNOSIS — I10 Essential (primary) hypertension: Secondary | ICD-10-CM

## 2019-09-25 ENCOUNTER — Encounter (HOSPITAL_BASED_OUTPATIENT_CLINIC_OR_DEPARTMENT_OTHER): Payer: Self-pay

## 2019-09-25 ENCOUNTER — Ambulatory Visit (HOSPITAL_BASED_OUTPATIENT_CLINIC_OR_DEPARTMENT_OTHER): Admit: 2019-09-25 | Payer: BLUE CROSS/BLUE SHIELD | Admitting: Obstetrics & Gynecology

## 2019-09-25 SURGERY — HYSTERECTOMY, TOTAL, LAPAROSCOPIC, WITH SALPINGECTOMY
Anesthesia: General | Laterality: Bilateral

## 2019-09-29 ENCOUNTER — Other Ambulatory Visit: Payer: Self-pay

## 2019-09-29 DIAGNOSIS — I10 Essential (primary) hypertension: Secondary | ICD-10-CM

## 2019-09-29 MED ORDER — BISOPROLOL-HYDROCHLOROTHIAZIDE 2.5-6.25 MG PO TABS: ORAL_TABLET | ORAL | 0 refills | Status: DC

## 2019-12-06 ENCOUNTER — Other Ambulatory Visit: Payer: Self-pay | Admitting: Internal Medicine

## 2019-12-06 MED ORDER — REPLESTA 1.25 MG (50000 UT) PO WAFR: WAFER | ORAL | 4 refills | Status: DC

## 2020-01-15 ENCOUNTER — Other Ambulatory Visit: Payer: Self-pay | Admitting: Adult Health Nurse Practitioner

## 2020-01-15 DIAGNOSIS — I10 Essential (primary) hypertension: Secondary | ICD-10-CM

## 2020-02-27 ENCOUNTER — Ambulatory Visit (INDEPENDENT_AMBULATORY_CARE_PROVIDER_SITE_OTHER): Payer: BC Managed Care – PPO | Admitting: Adult Health

## 2020-02-27 ENCOUNTER — Encounter: Payer: Self-pay | Admitting: Adult Health

## 2020-02-27 ENCOUNTER — Other Ambulatory Visit: Payer: Self-pay

## 2020-02-27 VITALS — BP 120/82 | HR 66 | Temp 97.3°F | Wt 195.2 lb

## 2020-02-27 DIAGNOSIS — Z8619 Personal history of other infectious and parasitic diseases: Secondary | ICD-10-CM | POA: Diagnosis not present

## 2020-02-27 DIAGNOSIS — R1013 Epigastric pain: Secondary | ICD-10-CM | POA: Diagnosis not present

## 2020-02-27 DIAGNOSIS — K5900 Constipation, unspecified: Secondary | ICD-10-CM

## 2020-02-27 MED ORDER — OMEPRAZOLE 20 MG PO CPDR: 20.0000 mg | DELAYED_RELEASE_CAPSULE | Freq: Two times a day (BID) | ORAL | 0 refills | Status: DC

## 2020-02-27 NOTE — Patient Instructions (Addendum)
It think it is most likely that you have some gastritis - inflammation/irritation of stomach lining We will make sure you don't have another H. Pylori infection   Then start acid relux medication for 2-4 weeks   Can also try adding mylanta/ pepto bismol for stomach to help coat -   Let me know tomorrow morning if that doesn't seem to be helping  Please add miralax, can also do magnesium citrate 1/2 bottle with 2 tall glasses of water, can repeat in 4 hours if needed to have a good bowel movement     Bland diet, no caffeine, spicy, alcohol, fatty, no NSAIDs (ibuprofen, aleve)    Gastritis, Adult  Gastritis is swelling (inflammation) of the stomach. Gastritis can develop quickly (acute). It can also develop slowly over time (chronic). It is important to get help for this condition. If you do not get help, your stomach can bleed, and you can get sores (ulcers) in your stomach. What are the causes? This condition may be caused by:  Germs that get to your stomach.  Drinking too much alcohol.  Medicines you are taking.  Too much acid in the stomach.  A disease of the intestines or stomach.  Stress.  An allergic reaction.  Crohn's disease.  Some cancer treatments (radiation). Sometimes the cause of this condition is not known. What are the signs or symptoms? Symptoms of this condition include:  Pain in your stomach.  A burning feeling in your stomach.  Feeling sick to your stomach (nauseous).  Throwing up (vomiting).  Feeling too full after you eat.  Weight loss.  Bad breath.  Throwing up blood.  Blood in your poop (stool). How is this diagnosed? This condition may be diagnosed with:  Your medical history and symptoms.  A physical exam.  Tests. These can include: ? Blood tests. ? Stool tests. ? A procedure to look inside your stomach (upper endoscopy). ? A test in which a sample of tissue is taken for testing (biopsy). How is this treated? Treatment  for this condition depends on what caused it. You may be given:  Antibiotic medicine, if your condition was caused by germs.  H2 blockers and similar medicines, if your condition was caused by too much acid. Follow these instructions at home: Medicines  Take over-the-counter and prescription medicines only as told by your doctor.  If you were prescribed an antibiotic medicine, take it as told by your doctor. Do not stop taking it even if you start to feel better. Eating and drinking   Eat small meals often, instead of large meals.  Avoid foods and drinks that make your symptoms worse.  Drink enough fluid to keep your pee (urine) pale yellow. Alcohol use  Do not drink alcohol if: ? Your doctor tells you not to drink. ? You are pregnant, may be pregnant, or are planning to become pregnant.  If you drink alcohol: ? Limit your use to:  0-1 drink a day for women.  0-2 drinks a day for men. ? Be aware of how much alcohol is in your drink. In the [place] one drink equals one 12 oz bottle of beer (355 mL), one 5 oz glass of wine (148 mL), or one 1 oz glass of hard liquor (44 mL). General instructions  Talk with your doctor about ways to manage stress. You can exercise or do deep breathing, meditation, or yoga.  Do not smoke or use products that have nicotine or tobacco. If you need help quitting, ask  your doctor.  Keep all follow-up visits as told by your doctor. This is important. Contact a doctor if:  Your symptoms get worse.  Your symptoms go away and then come back. Get help right away if:  You throw up blood or something that looks like coffee grounds.  You have black or dark red poop.  You throw up any time you try to drink fluids.  Your stomach pain gets worse.  You have a fever.  You do not feel better after one week. Summary  Gastritis is swelling (inflammation) of the stomach.  You must get help for this condition. If you do not get help, your stomach can  bleed, and you can get sores (ulcers).  This condition is diagnosed with medical history, physical exam, or tests.  You can be treated with medicines for germs or medicines to block too much acid in your stomach. This information is not intended to replace advice given to you by your health care provider. Make sure you discuss any questions you have with your health care provider. Document Revised: 08/10/2017 Document Reviewed: 08/10/2017 Elsevier Patient Education  [place].
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
## 2020-02-28 LAB — URINALYSIS, ROUTINE W REFLEX MICROSCOPIC
Bacteria, UA: NONE SEEN /HPF
Bilirubin Urine: NEGATIVE
Glucose, UA: NEGATIVE
Hyaline Cast: NONE SEEN /LPF
Ketones, ur: NEGATIVE
Leukocytes,Ua: NEGATIVE
Nitrite: NEGATIVE
Protein, ur: NEGATIVE
Specific Gravity, Urine: 1.012 (ref 1.001–1.03)
Squamous Epithelial / HPF: NONE SEEN /HPF (ref <=5)
WBC, UA: NONE SEEN /HPF (ref 0–5)
pH: <=5 (ref 5.0–8.0)

## 2020-02-28 LAB — TEST AUTHORIZATION

## 2020-02-28 LAB — CBC WITH DIFFERENTIAL/PLATELET
Absolute Monocytes: 434 cells/uL (ref 200–950)
Basophils Absolute: 31 cells/uL (ref 0–200)
Basophils Relative: 0.5 %
Eosinophils Absolute: 130 cells/uL (ref 15–500)
Eosinophils Relative: 2.1 %
HCT: 34.9 % — ABNORMAL LOW (ref 35.0–45.0)
Hemoglobin: 10.9 g/dL — ABNORMAL LOW (ref 11.7–15.5)
Lymphs Abs: 3094 cells/uL (ref 850–3900)
MCH: 25.7 pg — ABNORMAL LOW (ref 27.0–33.0)
MCHC: 31.2 g/dL — ABNORMAL LOW (ref 32.0–36.0)
MCV: 82.3 fL (ref 80.0–100.0)
MPV: 9.9 fL (ref 7.5–12.5)
Monocytes Relative: 7 %
Neutro Abs: 2511 cells/uL (ref 1500–7800)
Neutrophils Relative %: 40.5 %
Platelets: 393 10*3/uL (ref 140–400)
RBC: 4.24 10*6/uL (ref 3.80–5.10)
RDW: 13.3 % (ref 11.0–15.0)
Total Lymphocyte: 49.9 %
WBC: 6.2 10*3/uL (ref 3.8–10.8)

## 2020-02-28 LAB — COMPLETE METABOLIC PANEL WITH GFR
AG Ratio: 1.1 (calc) (ref 1.0–2.5)
ALT: 13 U/L (ref 6–29)
AST: 14 U/L (ref 10–30)
Albumin: 3.9 g/dL (ref 3.6–5.1)
Alkaline phosphatase (APISO): 66 U/L (ref 31–125)
BUN: 12 mg/dL (ref 7–25)
CO2: 28 mmol/L (ref 20–32)
Calcium: 10.1 mg/dL (ref 8.6–10.2)
Chloride: 101 mmol/L (ref 98–110)
Creat: 1.07 mg/dL (ref 0.50–1.10)
GFR, Est African American: 73 mL/min/{1.73_m2} (ref >=60)
GFR, Est Non African American: 63 mL/min/{1.73_m2} (ref >=60)
Globulin: 3.6 g/dL (calc) (ref 1.9–3.7)
Glucose, Bld: 88 mg/dL (ref 65–99)
Potassium: 4.3 mmol/L (ref 3.5–5.3)
Sodium: 136 mmol/L (ref 135–146)
Total Bilirubin: 0.7 mg/dL (ref 0.2–1.2)
Total Protein: 7.5 g/dL (ref 6.1–8.1)

## 2020-02-28 LAB — LIPASE: Lipase: 25 U/L (ref 7–60)

## 2020-02-28 LAB — H. PYLORI BREATH TEST: H. pylori Breath Test: NOT DETECTED

## 2020-04-05 ENCOUNTER — Encounter (HOSPITAL_COMMUNITY): Payer: Self-pay

## 2020-04-05 ENCOUNTER — Ambulatory Visit (HOSPITAL_COMMUNITY)
Admission: EM | Admit: 2020-04-05 | Discharge: 2020-04-05 | Disposition: A | Discharge status: Home / Self Care | Payer: BC Managed Care – PPO | Attending: Student | Admitting: Student

## 2020-04-05 ENCOUNTER — Other Ambulatory Visit: Payer: Self-pay

## 2020-04-05 DIAGNOSIS — G589 Mononeuropathy, unspecified: Secondary | ICD-10-CM

## 2020-04-05 MED ORDER — PREDNISONE 20 MG PO TABS: 40.0000 mg | ORAL_TABLET | Freq: Every day | ORAL | 0 refills | Status: AC

## 2020-04-05 MED ORDER — METAXALONE 800 MG PO TABS: 800.0000 mg | ORAL_TABLET | Freq: Three times a day (TID) | ORAL | 0 refills | Status: DC

## 2020-04-05 NOTE — Discharge Instructions (Addendum)
For your pinched nerve,  -Start the prednisone (steroid). Take 2 pills (40mg ) every morning for 5 days.  -Take the muscle relaxer as needed, up to 3x daily. Try this in the evening at first as it could make you drowsy.  -you can take up to 3200mg  of ibuprofen daily, with meals.  -Continue using your heating pad on your neck.

## 2020-04-05 NOTE — ED Triage Notes (Signed)
Pt presents with left side neck pain that radiates down into left shoulder and left arm X 3 days.
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
## 2020-04-17 ENCOUNTER — Other Ambulatory Visit: Payer: Self-pay | Admitting: Internal Medicine

## 2020-04-17 DIAGNOSIS — I10 Essential (primary) hypertension: Secondary | ICD-10-CM
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
## 2020-06-11 ENCOUNTER — Encounter: Payer: Self-pay | Admitting: Adult Health Nurse Practitioner

## 2020-06-11 ENCOUNTER — Ambulatory Visit (INDEPENDENT_AMBULATORY_CARE_PROVIDER_SITE_OTHER): Payer: BC Managed Care – PPO | Admitting: Adult Health Nurse Practitioner

## 2020-06-11 ENCOUNTER — Other Ambulatory Visit: Payer: Self-pay

## 2020-06-11 VITALS — BP 136/90 | HR 62 | Temp 97.5°F | Ht 62.0 in | Wt 203.0 lb

## 2020-06-11 DIAGNOSIS — Z131 Encounter for screening for diabetes mellitus: Secondary | ICD-10-CM

## 2020-06-11 DIAGNOSIS — Z130 Encounter for screening for diseases of the blood and blood-forming organs and certain disorders involving the immune mechanism: Secondary | ICD-10-CM

## 2020-06-11 DIAGNOSIS — Z13220 Encounter for screening for lipoid disorders: Secondary | ICD-10-CM | POA: Diagnosis not present

## 2020-06-11 DIAGNOSIS — I10 Essential (primary) hypertension: Secondary | ICD-10-CM | POA: Diagnosis not present

## 2020-06-11 DIAGNOSIS — Z1389 Encounter for screening for other disorder: Secondary | ICD-10-CM

## 2020-06-11 DIAGNOSIS — R7309 Other abnormal glucose: Secondary | ICD-10-CM

## 2020-06-11 DIAGNOSIS — Z136 Encounter for screening for cardiovascular disorders: Secondary | ICD-10-CM

## 2020-06-11 DIAGNOSIS — Z1321 Encounter for screening for nutritional disorder: Secondary | ICD-10-CM

## 2020-06-11 DIAGNOSIS — E559 Vitamin D deficiency, unspecified: Secondary | ICD-10-CM | POA: Diagnosis not present

## 2020-06-11 DIAGNOSIS — Z1329 Encounter for screening for other suspected endocrine disorder: Secondary | ICD-10-CM

## 2020-06-11 DIAGNOSIS — F3341 Major depressive disorder, recurrent, in partial remission: Secondary | ICD-10-CM

## 2020-06-11 DIAGNOSIS — E782 Mixed hyperlipidemia: Secondary | ICD-10-CM

## 2020-06-11 DIAGNOSIS — Z79899 Other long term (current) drug therapy: Secondary | ICD-10-CM

## 2020-06-11 DIAGNOSIS — Z0000 Encounter for general adult medical examination without abnormal findings: Secondary | ICD-10-CM | POA: Diagnosis not present

## 2020-06-11 DIAGNOSIS — J309 Allergic rhinitis, unspecified: Secondary | ICD-10-CM

## 2020-06-11 NOTE — Patient Instructions (Signed)

## 2020-06-12 LAB — COMPLETE METABOLIC PANEL WITH GFR
AG Ratio: 1.2 (calc) (ref 1.0–2.5)
ALT: 12 U/L (ref 6–29)
AST: 15 U/L (ref 10–30)
Albumin: 3.8 g/dL (ref 3.6–5.1)
Alkaline phosphatase (APISO): 57 U/L (ref 31–125)
BUN: 13 mg/dL (ref 7–25)
CO2: 24 mmol/L (ref 20–32)
Calcium: 9.2 mg/dL (ref 8.6–10.2)
Chloride: 103 mmol/L (ref 98–110)
Creat: 0.98 mg/dL (ref 0.50–1.10)
GFR, Est African American: 81 mL/min/{1.73_m2} (ref >=60)
GFR, Est Non African American: 70 mL/min/{1.73_m2} (ref >=60)
Globulin: 3.2 g/dL (calc) (ref 1.9–3.7)
Glucose, Bld: 82 mg/dL (ref 65–99)
Potassium: 4.2 mmol/L (ref 3.5–5.3)
Sodium: 136 mmol/L (ref 135–146)
Total Bilirubin: 0.3 mg/dL (ref 0.2–1.2)
Total Protein: 7 g/dL (ref 6.1–8.1)

## 2020-06-12 LAB — URINALYSIS W MICROSCOPIC + REFLEX CULTURE
Bacteria, UA: NONE SEEN /HPF
Bilirubin Urine: NEGATIVE
Glucose, UA: NEGATIVE
Hyaline Cast: NONE SEEN /LPF
Ketones, ur: NEGATIVE
Leukocyte Esterase: NEGATIVE
Nitrites, Initial: NEGATIVE
Protein, ur: NEGATIVE
RBC / HPF: NONE SEEN /HPF (ref 0–2)
Specific Gravity, Urine: 1.015 (ref 1.001–1.03)
Squamous Epithelial / HPF: NONE SEEN /HPF (ref <=5)
WBC, UA: NONE SEEN /HPF (ref 0–5)
pH: 5.5 (ref 5.0–8.0)

## 2020-06-12 LAB — IRON, TOTAL/TOTAL IRON BINDING CAP
%SAT: 4 % (calc) — ABNORMAL LOW (ref 16–45)
Iron: 16 ug/dL — ABNORMAL LOW (ref 40–190)
TIBC: 369 mcg/dL (calc) (ref 250–450)

## 2020-06-12 LAB — CBC WITH DIFFERENTIAL/PLATELET
Absolute Monocytes: 480 cells/uL (ref 200–950)
Basophils Absolute: 38 cells/uL (ref 0–200)
Basophils Relative: 0.5 %
Eosinophils Absolute: 143 cells/uL (ref 15–500)
Eosinophils Relative: 1.9 %
HCT: 31.8 % — ABNORMAL LOW (ref 35.0–45.0)
Hemoglobin: 10.1 g/dL — ABNORMAL LOW (ref 11.7–15.5)
Lymphs Abs: 3353 cells/uL (ref 850–3900)
MCH: 26.4 pg — ABNORMAL LOW (ref 27.0–33.0)
MCHC: 31.8 g/dL — ABNORMAL LOW (ref 32.0–36.0)
MCV: 83 fL (ref 80.0–100.0)
MPV: 10.4 fL (ref 7.5–12.5)
Monocytes Relative: 6.4 %
Neutro Abs: 3488 cells/uL (ref 1500–7800)
Neutrophils Relative %: 46.5 %
Platelets: 351 10*3/uL (ref 140–400)
RBC: 3.83 10*6/uL (ref 3.80–5.10)
RDW: 14.3 % (ref 11.0–15.0)
Total Lymphocyte: 44.7 %
WBC: 7.5 10*3/uL (ref 3.8–10.8)

## 2020-06-12 LAB — TSH: TSH: 2.42 mIU/L

## 2020-06-12 LAB — LIPID PANEL
Cholesterol: 183 mg/dL (ref <200)
HDL: 58 mg/dL (ref >=50)
LDL Cholesterol (Calc): 102 mg/dL (calc) — ABNORMAL HIGH
Non-HDL Cholesterol (Calc): 125 mg/dL (calc) (ref <130)
Total CHOL/HDL Ratio: 3.2 (calc) (ref <5.0)
Triglycerides: 133 mg/dL (ref <150)

## 2020-06-12 LAB — HEMOGLOBIN A1C
Hgb A1c MFr Bld: 5.8 % of total Hgb — ABNORMAL HIGH (ref <5.7)
Mean Plasma Glucose: 120 mg/dL
eAG (mmol/L): 6.6 mmol/L

## 2020-06-12 LAB — VITAMIN B12: Vitamin B-12: 352 pg/mL (ref 200–1100)

## 2020-06-12 LAB — VITAMIN D 25 HYDROXY (VIT D DEFICIENCY, FRACTURES): Vit D, 25-Hydroxy: 34 ng/mL (ref 30–100)

## 2020-06-12 LAB — NO CULTURE INDICATED

## 2020-07-19 ENCOUNTER — Other Ambulatory Visit: Payer: Self-pay | Admitting: Internal Medicine

## 2020-07-19 DIAGNOSIS — I10 Essential (primary) hypertension: Secondary | ICD-10-CM

## 2021-03-14 ENCOUNTER — Other Ambulatory Visit: Payer: Self-pay | Admitting: Internal Medicine

## 2021-03-14 DIAGNOSIS — I10 Essential (primary) hypertension: Secondary | ICD-10-CM

## 2021-03-14 MED ORDER — BISOPROLOL-HYDROCHLOROTHIAZIDE 2.5-6.25 MG PO TABS: ORAL_TABLET | ORAL | 3 refills | Status: DC
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
## 2021-06-11 ENCOUNTER — Encounter: Payer: Self-pay | Admitting: Nurse Practitioner

## 2021-06-11 DIAGNOSIS — R7309 Other abnormal glucose: Secondary | ICD-10-CM

## 2021-06-11 DIAGNOSIS — Z79899 Other long term (current) drug therapy: Secondary | ICD-10-CM

## 2021-06-11 DIAGNOSIS — Z130 Encounter for screening for diseases of the blood and blood-forming organs and certain disorders involving the immune mechanism: Secondary | ICD-10-CM

## 2021-06-11 DIAGNOSIS — Z1329 Encounter for screening for other suspected endocrine disorder: Secondary | ICD-10-CM

## 2021-06-11 DIAGNOSIS — E782 Mixed hyperlipidemia: Secondary | ICD-10-CM

## 2021-06-11 DIAGNOSIS — Z0001 Encounter for general adult medical examination with abnormal findings: Secondary | ICD-10-CM

## 2021-06-11 DIAGNOSIS — J309 Allergic rhinitis, unspecified: Secondary | ICD-10-CM

## 2021-06-11 DIAGNOSIS — I10 Essential (primary) hypertension: Secondary | ICD-10-CM

## 2021-06-11 DIAGNOSIS — Z136 Encounter for screening for cardiovascular disorders: Secondary | ICD-10-CM

## 2021-06-11 DIAGNOSIS — E559 Vitamin D deficiency, unspecified: Secondary | ICD-10-CM

## 2021-06-11 DIAGNOSIS — F3341 Major depressive disorder, recurrent, in partial remission: Secondary | ICD-10-CM

## 2021-06-11 DIAGNOSIS — Z1389 Encounter for screening for other disorder: Secondary | ICD-10-CM
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
## 2021-06-25 ENCOUNTER — Encounter: Payer: BC Managed Care – PPO | Admitting: Nurse Practitioner

## 2021-06-25 DIAGNOSIS — Z0000 Encounter for general adult medical examination without abnormal findings: Secondary | ICD-10-CM

## 2021-06-25 DIAGNOSIS — Z79899 Other long term (current) drug therapy: Secondary | ICD-10-CM

## 2021-06-25 DIAGNOSIS — I10 Essential (primary) hypertension: Secondary | ICD-10-CM

## 2021-06-25 DIAGNOSIS — Z0001 Encounter for general adult medical examination with abnormal findings: Secondary | ICD-10-CM

## 2021-06-25 DIAGNOSIS — Z136 Encounter for screening for cardiovascular disorders: Secondary | ICD-10-CM

## 2021-06-25 DIAGNOSIS — E782 Mixed hyperlipidemia: Secondary | ICD-10-CM

## 2021-06-25 DIAGNOSIS — E559 Vitamin D deficiency, unspecified: Secondary | ICD-10-CM

## 2021-06-25 DIAGNOSIS — Z1329 Encounter for screening for other suspected endocrine disorder: Secondary | ICD-10-CM

## 2021-06-25 DIAGNOSIS — Z1389 Encounter for screening for other disorder: Secondary | ICD-10-CM

## 2021-06-25 DIAGNOSIS — R7309 Other abnormal glucose: Secondary | ICD-10-CM

## 2021-06-25 DIAGNOSIS — F3341 Major depressive disorder, recurrent, in partial remission: Secondary | ICD-10-CM
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
## 2021-07-16 ENCOUNTER — Ambulatory Visit (INDEPENDENT_AMBULATORY_CARE_PROVIDER_SITE_OTHER): Payer: BC Managed Care – PPO | Admitting: Nurse Practitioner

## 2021-07-16 ENCOUNTER — Encounter: Payer: Self-pay | Admitting: Nurse Practitioner

## 2021-07-16 VITALS — BP 128/88 | HR 65 | Temp 97.7°F | Ht 62.0 in | Wt 200.0 lb

## 2021-07-16 DIAGNOSIS — E559 Vitamin D deficiency, unspecified: Secondary | ICD-10-CM

## 2021-07-16 DIAGNOSIS — R7309 Other abnormal glucose: Secondary | ICD-10-CM

## 2021-07-16 DIAGNOSIS — Z1329 Encounter for screening for other suspected endocrine disorder: Secondary | ICD-10-CM

## 2021-07-16 DIAGNOSIS — Z1389 Encounter for screening for other disorder: Secondary | ICD-10-CM

## 2021-07-16 DIAGNOSIS — Z131 Encounter for screening for diabetes mellitus: Secondary | ICD-10-CM | POA: Diagnosis not present

## 2021-07-16 DIAGNOSIS — I10 Essential (primary) hypertension: Secondary | ICD-10-CM | POA: Diagnosis not present

## 2021-07-16 DIAGNOSIS — Z0001 Encounter for general adult medical examination with abnormal findings: Secondary | ICD-10-CM

## 2021-07-16 DIAGNOSIS — Z136 Encounter for screening for cardiovascular disorders: Secondary | ICD-10-CM | POA: Diagnosis not present

## 2021-07-16 DIAGNOSIS — J309 Allergic rhinitis, unspecified: Secondary | ICD-10-CM

## 2021-07-16 DIAGNOSIS — Z79899 Other long term (current) drug therapy: Secondary | ICD-10-CM

## 2021-07-16 DIAGNOSIS — F3341 Major depressive disorder, recurrent, in partial remission: Secondary | ICD-10-CM

## 2021-07-16 DIAGNOSIS — R001 Bradycardia, unspecified: Secondary | ICD-10-CM

## 2021-07-16 DIAGNOSIS — Z13220 Encounter for screening for lipoid disorders: Secondary | ICD-10-CM

## 2021-07-16 DIAGNOSIS — E782 Mixed hyperlipidemia: Secondary | ICD-10-CM

## 2021-07-16 DIAGNOSIS — Z0000 Encounter for general adult medical examination without abnormal findings: Secondary | ICD-10-CM

## 2021-07-16 DIAGNOSIS — H4010X0 Unspecified open-angle glaucoma, stage unspecified: Secondary | ICD-10-CM

## 2021-07-16 NOTE — Patient Instructions (Signed)

## 2021-07-17 LAB — CBC WITH DIFFERENTIAL/PLATELET
Absolute Monocytes: 497 cells/uL (ref 200–950)
Basophils Absolute: 21 cells/uL (ref 0–200)
Basophils Relative: 0.3 %
Eosinophils Absolute: 161 cells/uL (ref 15–500)
Eosinophils Relative: 2.3 %
HCT: 34.5 % — ABNORMAL LOW (ref 35.0–45.0)
Hemoglobin: 10.9 g/dL — ABNORMAL LOW (ref 11.7–15.5)
Lymphs Abs: 3311 cells/uL (ref 850–3900)
MCH: 25.9 pg — ABNORMAL LOW (ref 27.0–33.0)
MCHC: 31.6 g/dL — ABNORMAL LOW (ref 32.0–36.0)
MCV: 81.9 fL (ref 80.0–100.0)
MPV: 9.9 fL (ref 7.5–12.5)
Monocytes Relative: 7.1 %
Neutro Abs: 3010 cells/uL (ref 1500–7800)
Neutrophils Relative %: 43 %
Platelets: 384 10*3/uL (ref 140–400)
RBC: 4.21 10*6/uL (ref 3.80–5.10)
RDW: 13.2 % (ref 11.0–15.0)
Total Lymphocyte: 47.3 %
WBC: 7 10*3/uL (ref 3.8–10.8)

## 2021-07-17 LAB — COMPLETE METABOLIC PANEL WITH GFR
AG Ratio: 1 (calc) (ref 1.0–2.5)
ALT: 13 U/L (ref 6–29)
AST: 14 U/L (ref 10–35)
Albumin: 3.8 g/dL (ref 3.6–5.1)
Alkaline phosphatase (APISO): 61 U/L (ref 31–125)
BUN/Creatinine Ratio: 10 (calc) (ref 6–22)
BUN: 11 mg/dL (ref 7–25)
CO2: 27 mmol/L (ref 20–32)
Calcium: 9.6 mg/dL (ref 8.6–10.2)
Chloride: 101 mmol/L (ref 98–110)
Creat: 1.1 mg/dL — ABNORMAL HIGH (ref 0.50–0.99)
Globulin: 3.7 g/dL (calc) (ref 1.9–3.7)
Glucose, Bld: 83 mg/dL (ref 65–99)
Potassium: 3.9 mmol/L (ref 3.5–5.3)
Sodium: 136 mmol/L (ref 135–146)
Total Bilirubin: 0.4 mg/dL (ref 0.2–1.2)
Total Protein: 7.5 g/dL (ref 6.1–8.1)
eGFR: 63 mL/min/{1.73_m2} (ref >=60)

## 2021-07-17 LAB — URINALYSIS, ROUTINE W REFLEX MICROSCOPIC
Bilirubin Urine: NEGATIVE
Glucose, UA: NEGATIVE
Hgb urine dipstick: NEGATIVE
Ketones, ur: NEGATIVE
Leukocytes,Ua: NEGATIVE
Nitrite: NEGATIVE
Protein, ur: NEGATIVE
Specific Gravity, Urine: 1.004 (ref 1.001–1.035)
pH: 6 (ref 5.0–8.0)

## 2021-07-17 LAB — LIPID PANEL
Cholesterol: 192 mg/dL (ref <200)
HDL: 45 mg/dL — ABNORMAL LOW (ref >=50)
LDL Cholesterol (Calc): 113 mg/dL (calc) — ABNORMAL HIGH
Non-HDL Cholesterol (Calc): 147 mg/dL (calc) — ABNORMAL HIGH (ref <130)
Total CHOL/HDL Ratio: 4.3 (calc) (ref <5.0)
Triglycerides: 218 mg/dL — ABNORMAL HIGH (ref <150)

## 2021-07-17 LAB — MICROALBUMIN / CREATININE URINE RATIO
Creatinine, Urine: 31 mg/dL (ref 20–275)
Microalb, Ur: <0.2 mg/dL

## 2021-07-17 LAB — VITAMIN D 25 HYDROXY (VIT D DEFICIENCY, FRACTURES): Vit D, 25-Hydroxy: 35 ng/mL (ref 30–100)

## 2021-07-17 LAB — HEMOGLOBIN A1C
Hgb A1c MFr Bld: 5.9 % of total Hgb — ABNORMAL HIGH (ref <5.7)
Mean Plasma Glucose: 123 mg/dL
eAG (mmol/L): 6.8 mmol/L

## 2021-07-17 LAB — MAGNESIUM: Magnesium: 1.8 mg/dL (ref 1.5–2.5)

## 2021-07-17 LAB — TSH: TSH: 3.61 mIU/L
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
## 2022-01-20 ENCOUNTER — Ambulatory Visit: Payer: BC Managed Care – PPO | Admitting: Nurse Practitioner
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
## 2022-02-03 ENCOUNTER — Ambulatory Visit (INDEPENDENT_AMBULATORY_CARE_PROVIDER_SITE_OTHER): Payer: BC Managed Care – PPO | Admitting: Nurse Practitioner

## 2022-02-03 ENCOUNTER — Encounter: Payer: Self-pay | Admitting: Nurse Practitioner

## 2022-02-03 VITALS — BP 118/82 | HR 60 | Temp 97.7°F | Ht 62.0 in | Wt 200.0 lb

## 2022-02-03 DIAGNOSIS — R7309 Other abnormal glucose: Secondary | ICD-10-CM | POA: Diagnosis not present

## 2022-02-03 DIAGNOSIS — I10 Essential (primary) hypertension: Secondary | ICD-10-CM | POA: Diagnosis not present

## 2022-02-03 DIAGNOSIS — F3341 Major depressive disorder, recurrent, in partial remission: Secondary | ICD-10-CM | POA: Diagnosis not present

## 2022-02-03 DIAGNOSIS — E782 Mixed hyperlipidemia: Secondary | ICD-10-CM | POA: Diagnosis not present

## 2022-02-03 DIAGNOSIS — Z79899 Other long term (current) drug therapy: Secondary | ICD-10-CM

## 2022-02-03 DIAGNOSIS — E559 Vitamin D deficiency, unspecified: Secondary | ICD-10-CM

## 2022-02-03 MED ORDER — REPLESTA 1.25 MG (50000 UT) PO WAFR: WAFER | ORAL | 4 refills | Status: DC

## 2022-02-03 NOTE — Patient Instructions (Addendum)
LDL "bad" cholesterol levels are 113  Normal is <100 and ideal/low risk to avoid future heart attack/stroke is below 70.   Most cholesterol and saturated fat comes from animal sources - meat, egg yolks, high fat dairy- so recommend limiting your intake of these, if you consume, try to use leanest option possible in small portions. It's also very important to be conscious of highly processed foods - chips, pastries, oils, etc. Anything that comes with a label should indicate saturated fat content and continue to reduce this until LDL numbers are improved.  It's also important to increase the good things - particularly soluble fiber. Focus diet on whole foods, eating mostly plant-based items - fruit/vegetables, beans, nuts/seeds (chia, ground flax, etc), avocado/avocado oil, olives/olive oil, and whole grains (old fashioned oats, quinoa, etc) in moderation. If not already doing so, you could also add a daily soluble fiber supplement such as citrucel or benefiber.     GENERAL HEALTH GOALS   Know what a healthy weight is for you (roughly BMI <25) and aim to maintain this   Aim for 7+ servings of fruits and vegetables daily   70-80+ fluid ounces of water or unsweet tea for healthy kidneys   Limit to max 1 drink of alcohol per day; avoid smoking/tobacco   Limit animal fats in diet for cholesterol and heart health - choose grass fed whenever available   Avoid highly processed foods, and foods high in saturated/trans fats   Aim for low stress - take time to unwind and care for your mental health   Aim for 150 min of moderate intensity exercise weekly for heart health, and weights twice weekly for bone health   Aim for 7-9 hours of sleep daily

## 2022-02-04 LAB — COMPLETE METABOLIC PANEL WITH GFR
AG Ratio: 1.1 (calc) (ref 1.0–2.5)
ALT: 13 U/L (ref 6–29)
AST: 14 U/L (ref 10–35)
Albumin: 4 g/dL (ref 3.6–5.1)
Alkaline phosphatase (APISO): 66 U/L (ref 31–125)
BUN: 14 mg/dL (ref 7–25)
CO2: 27 mmol/L (ref 20–32)
Calcium: 9.3 mg/dL (ref 8.6–10.2)
Chloride: 103 mmol/L (ref 98–110)
Creat: 0.93 mg/dL (ref 0.50–0.99)
Globulin: 3.5 g/dL (calc) (ref 1.9–3.7)
Glucose, Bld: 92 mg/dL (ref 65–99)
Potassium: 3.8 mmol/L (ref 3.5–5.3)
Sodium: 137 mmol/L (ref 135–146)
Total Bilirubin: 0.3 mg/dL (ref 0.2–1.2)
Total Protein: 7.5 g/dL (ref 6.1–8.1)
eGFR: 77 mL/min/{1.73_m2} (ref >=60)

## 2022-02-04 LAB — CBC WITH DIFFERENTIAL/PLATELET
Absolute Monocytes: 380 cells/uL (ref 200–950)
Basophils Absolute: 48 cells/uL (ref 0–200)
Basophils Relative: 0.7 %
Eosinophils Absolute: 193 cells/uL (ref 15–500)
Eosinophils Relative: 2.8 %
HCT: 33.9 % — ABNORMAL LOW (ref 35.0–45.0)
Hemoglobin: 10.7 g/dL — ABNORMAL LOW (ref 11.7–15.5)
Lymphs Abs: 3643 cells/uL (ref 850–3900)
MCH: 25.8 pg — ABNORMAL LOW (ref 27.0–33.0)
MCHC: 31.6 g/dL — ABNORMAL LOW (ref 32.0–36.0)
MCV: 81.7 fL (ref 80.0–100.0)
MPV: 10.5 fL (ref 7.5–12.5)
Monocytes Relative: 5.5 %
Neutro Abs: 2636 cells/uL (ref 1500–7800)
Neutrophils Relative %: 38.2 %
Platelets: 401 10*3/uL — ABNORMAL HIGH (ref 140–400)
RBC: 4.15 10*6/uL (ref 3.80–5.10)
RDW: 13.1 % (ref 11.0–15.0)
Total Lymphocyte: 52.8 %
WBC: 6.9 10*3/uL (ref 3.8–10.8)

## 2022-02-04 LAB — HEMOGLOBIN A1C
Hgb A1c MFr Bld: 6.1 % of total Hgb — ABNORMAL HIGH (ref <5.7)
Mean Plasma Glucose: 128 mg/dL
eAG (mmol/L): 7.1 mmol/L

## 2022-02-04 LAB — LIPID PANEL
Cholesterol: 182 mg/dL (ref <200)
HDL: 40 mg/dL — ABNORMAL LOW (ref >=50)
LDL Cholesterol (Calc): 109 mg/dL (calc) — ABNORMAL HIGH
Non-HDL Cholesterol (Calc): 142 mg/dL (calc) — ABNORMAL HIGH (ref <130)
Total CHOL/HDL Ratio: 4.6 (calc) (ref <5.0)
Triglycerides: 213 mg/dL — ABNORMAL HIGH (ref <150)

## 2022-04-15 ENCOUNTER — Other Ambulatory Visit: Payer: Self-pay | Admitting: Internal Medicine

## 2022-04-15 DIAGNOSIS — I10 Essential (primary) hypertension: Secondary | ICD-10-CM

## 2022-06-15 ENCOUNTER — Encounter: Payer: BC Managed Care – PPO | Admitting: Nurse Practitioner

## 2022-06-29 ENCOUNTER — Encounter: Payer: BC Managed Care – PPO | Admitting: Nurse Practitioner
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
## 2022-07-17 ENCOUNTER — Encounter: Payer: BC Managed Care – PPO | Admitting: Nurse Practitioner

## 2022-07-17 DIAGNOSIS — Z1389 Encounter for screening for other disorder: Secondary | ICD-10-CM

## 2022-07-17 DIAGNOSIS — F3341 Major depressive disorder, recurrent, in partial remission: Secondary | ICD-10-CM

## 2022-07-17 DIAGNOSIS — J309 Allergic rhinitis, unspecified: Secondary | ICD-10-CM

## 2022-07-17 DIAGNOSIS — H4010X0 Unspecified open-angle glaucoma, stage unspecified: Secondary | ICD-10-CM

## 2022-07-17 DIAGNOSIS — Z79899 Other long term (current) drug therapy: Secondary | ICD-10-CM

## 2022-07-17 DIAGNOSIS — I10 Essential (primary) hypertension: Secondary | ICD-10-CM

## 2022-07-17 DIAGNOSIS — R7309 Other abnormal glucose: Secondary | ICD-10-CM

## 2022-07-17 DIAGNOSIS — R001 Bradycardia, unspecified: Secondary | ICD-10-CM

## 2022-07-17 DIAGNOSIS — Z0001 Encounter for general adult medical examination with abnormal findings: Secondary | ICD-10-CM

## 2022-07-17 DIAGNOSIS — E782 Mixed hyperlipidemia: Secondary | ICD-10-CM

## 2022-07-17 DIAGNOSIS — Z136 Encounter for screening for cardiovascular disorders: Secondary | ICD-10-CM

## 2022-07-17 DIAGNOSIS — E559 Vitamin D deficiency, unspecified: Secondary | ICD-10-CM

## 2022-07-17 DIAGNOSIS — Z1329 Encounter for screening for other suspected endocrine disorder: Secondary | ICD-10-CM
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
## 2022-09-22 ENCOUNTER — Encounter: Payer: BC Managed Care – PPO | Admitting: Nurse Practitioner

## 2022-09-22 DIAGNOSIS — R7309 Other abnormal glucose: Secondary | ICD-10-CM

## 2022-09-22 DIAGNOSIS — Z0001 Encounter for general adult medical examination with abnormal findings: Secondary | ICD-10-CM

## 2022-09-22 DIAGNOSIS — F3341 Major depressive disorder, recurrent, in partial remission: Secondary | ICD-10-CM

## 2022-09-22 DIAGNOSIS — Z79899 Other long term (current) drug therapy: Secondary | ICD-10-CM

## 2022-09-22 DIAGNOSIS — Z136 Encounter for screening for cardiovascular disorders: Secondary | ICD-10-CM

## 2022-09-22 DIAGNOSIS — Z1389 Encounter for screening for other disorder: Secondary | ICD-10-CM

## 2022-09-22 DIAGNOSIS — Z1329 Encounter for screening for other suspected endocrine disorder: Secondary | ICD-10-CM

## 2022-09-22 DIAGNOSIS — H4010X0 Unspecified open-angle glaucoma, stage unspecified: Secondary | ICD-10-CM

## 2022-09-22 DIAGNOSIS — R001 Bradycardia, unspecified: Secondary | ICD-10-CM

## 2022-09-22 DIAGNOSIS — E782 Mixed hyperlipidemia: Secondary | ICD-10-CM

## 2022-09-22 DIAGNOSIS — I10 Essential (primary) hypertension: Secondary | ICD-10-CM

## 2022-09-22 DIAGNOSIS — E559 Vitamin D deficiency, unspecified: Secondary | ICD-10-CM

## 2022-09-22 DIAGNOSIS — J309 Allergic rhinitis, unspecified: Secondary | ICD-10-CM
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
## 2022-09-30 ENCOUNTER — Ambulatory Visit (INDEPENDENT_AMBULATORY_CARE_PROVIDER_SITE_OTHER): Payer: BC Managed Care – PPO | Admitting: Nurse Practitioner

## 2022-09-30 ENCOUNTER — Encounter: Payer: Self-pay | Admitting: Nurse Practitioner

## 2022-09-30 VITALS — BP 120/76 | HR 66 | Temp 97.7°F | Ht 61.5 in | Wt 195.0 lb

## 2022-09-30 DIAGNOSIS — E559 Vitamin D deficiency, unspecified: Secondary | ICD-10-CM

## 2022-09-30 DIAGNOSIS — F3341 Major depressive disorder, recurrent, in partial remission: Secondary | ICD-10-CM

## 2022-09-30 DIAGNOSIS — R7309 Other abnormal glucose: Secondary | ICD-10-CM

## 2022-09-30 DIAGNOSIS — Z1329 Encounter for screening for other suspected endocrine disorder: Secondary | ICD-10-CM

## 2022-09-30 DIAGNOSIS — Z1389 Encounter for screening for other disorder: Secondary | ICD-10-CM

## 2022-09-30 DIAGNOSIS — H4010X0 Unspecified open-angle glaucoma, stage unspecified: Secondary | ICD-10-CM

## 2022-09-30 DIAGNOSIS — Z13220 Encounter for screening for lipoid disorders: Secondary | ICD-10-CM

## 2022-09-30 DIAGNOSIS — J309 Allergic rhinitis, unspecified: Secondary | ICD-10-CM

## 2022-09-30 DIAGNOSIS — Z131 Encounter for screening for diabetes mellitus: Secondary | ICD-10-CM | POA: Diagnosis not present

## 2022-09-30 DIAGNOSIS — Z0001 Encounter for general adult medical examination with abnormal findings: Secondary | ICD-10-CM

## 2022-09-30 DIAGNOSIS — Z79899 Other long term (current) drug therapy: Secondary | ICD-10-CM

## 2022-09-30 DIAGNOSIS — F419 Anxiety disorder, unspecified: Secondary | ICD-10-CM

## 2022-09-30 DIAGNOSIS — Z136 Encounter for screening for cardiovascular disorders: Secondary | ICD-10-CM

## 2022-09-30 DIAGNOSIS — Z0000 Encounter for general adult medical examination without abnormal findings: Secondary | ICD-10-CM | POA: Diagnosis not present

## 2022-09-30 DIAGNOSIS — I10 Essential (primary) hypertension: Secondary | ICD-10-CM | POA: Diagnosis not present

## 2022-09-30 DIAGNOSIS — E782 Mixed hyperlipidemia: Secondary | ICD-10-CM

## 2022-09-30 DIAGNOSIS — R001 Bradycardia, unspecified: Secondary | ICD-10-CM

## 2022-09-30 DIAGNOSIS — F988 Other specified behavioral and emotional disorders with onset usually occurring in childhood and adolescence: Secondary | ICD-10-CM

## 2022-09-30 LAB — CBC WITH DIFFERENTIAL/PLATELET
Absolute Monocytes: 403 cells/uL (ref 200–950)
Basophils Relative: 0.6 %
MCV: 80.5 fL (ref 80.0–100.0)
Neutrophils Relative %: 41.8 %

## 2022-09-30 MED ORDER — LISDEXAMFETAMINE DIMESYLATE 20 MG PO CAPS
20.0000 mg | ORAL_CAPSULE | Freq: Every day | ORAL | 0 refills | Status: DC
Start: 2022-09-30 — End: 2022-11-16

## 2022-09-30 MED ORDER — REPLESTA 1.25 MG (50000 UT) PO WAFR
WAFER | ORAL | 4 refills | Status: DC
Start: 2022-09-30 — End: 2022-10-01

## 2022-09-30 MED ORDER — BUSPIRONE HCL 5 MG PO TABS
5.0000 mg | ORAL_TABLET | Freq: Three times a day (TID) | ORAL | 2 refills | Status: AC
Start: 2022-09-30 — End: ?

## 2022-09-30 NOTE — Patient Instructions (Addendum)
Use Buspirone 5 mg up to three times a day as needed for anxiety  Lisdexamfetamine Capsules What is this medication? LISDEXAMFETAMINE (lis DEX am fet a meen) treats attention-deficit hyperactivity disorder (ADHD). It may also be used to treat binge eating disorder. It works by reducing hyperactivity and impulsive behaviors. It belongs to a group of medications called stimulants. This medicine may be used for other purposes; ask your health care provider or pharmacist if you have questions. COMMON BRAND NAME(S): Vyvanse What should I tell my care team before I take this medication? They need to know if you have any of these conditions: Anxiety or panic attacks Circulation problems in fingers and toes Glaucoma Hardening or blockages of the arteries or heart blood vessels Heart disease or a heart defect High blood pressure History of stroke Kidney disease Liver disease Mental health condition Seizures Substance use disorder Suicidal thoughts, plans, or attempt by you or a family member Thyroid disease Tourette's syndrome An unusual or allergic reaction to lisdexamfetamine, other medications, foods, dyes, or preservatives Pregnant or trying to get pregnant Breast-feeding How should I use this medication? Take this medication by mouth. Follow the directions on the prescription label. Swallow the capsules with a drink of water. You may open capsule and add to a glass of water, then drink right away. Take your doses at regular intervals. Do not take your medication more often than directed. Do not suddenly stop your medication. You must gradually reduce the dose, or you may feel withdrawal effects. Ask your care team for advice. A special MedGuide will be given to you by the pharmacist with each prescription and refill. Be sure to read this information carefully each time. Talk to your care team about the use of this medication in children. While this medication may be prescribed for children  as young as 56 years of age for selected conditions, precautions do apply. Overdosage: If you think you have taken too much of this medicine contact a poison control center or emergency room at once. NOTE: This medicine is only for you. Do not share this medicine with others. What if I miss a dose? If you miss a dose, take it as soon as you can. If it is almost time for your next dose, take only that dose. Do not take double or extra doses. What may interact with this medication? Do not take this medication with any of the following: Linezolid MAOIs, such as Marplan, Nardil, and Parnate Methylene blue This medication may also interact with the following: Acetazolamide Alcohol Ascorbic acid Certain medications for depression, anxiety, or other mental health conditions Certain medications for migraines, such as sumatriptan Guanethidine Opioids Reserpine Sodium bicarbonate St. John's wort Thiazide diuretics, such as chlorothiazide Tryptophan This list may not describe all possible interactions. Give your health care provider a list of all the medicines, herbs, non-prescription drugs, or dietary supplements you use. Also tell them if you smoke, drink alcohol, or use illegal drugs. Some items may interact with your medicine. What should I watch for while using this medication? Visit your care team for regular checks on your progress. Tell your care team if your symptoms do not start to get better or if they get worse. This medication requires a new prescription from your care team every time it is filled at the pharmacy. This medication can be abused and cause your brain and body to depend on it after high doses or long term use. Your care team will assess your risk and monitor  you closely during treatment. Long term use of this medication may cause your brain and body to depend on it. You may be able to take breaks from this medication during weekends, holidays, or summer vacations. Talk to  your care team about what works for you. If your care team wants you to stop this medication permanently, the dose may be slowly lowered over time to reduce the risk of side effects. Tell your care team if this medication loses its effects, or if you feel you need to take more than the prescribed amount. Do not change your dose without talking to your care team. Do not take this medication close to bedtime. It may prevent you from sleeping. Loss of appetite is common when starting this medication. Eating small, frequent meals or snacks can help. Talk to your care team if appetite loss persists. Children should have height and weight checked often while taking this medication. Tell your care team right away if you notice unexplained wounds on your fingers and toes while taking this medication. You should also tell your care team if you experience numbness or pain, changes in the skin color, or sensitivity to temperature in your fingers or toes. Contact your care team right away if you have an erection that lasts longer than 4 hours or if it becomes painful. This may be a sign of a serious problem and must be treated right away to prevent permanent damage. What side effects may I notice from receiving this medication? Side effects that you should report to your care team as soon as possible: Allergic reactions--skin rash, itching, hives, swelling of the face, lips, tongue, or throat Heart attack--pain or tightness in the chest, shoulders, arms, or jaw, nausea, shortness of breath, cold or clammy skin, feeling faint or lightheaded Heart rhythm changes--fast or irregular heartbeat, dizziness, feeling faint or lightheaded, chest pain, trouble breathing Increase in blood pressure Irritability, confusion, fast or irregular heartbeat, muscle stiffness, twitching muscles, sweating, high fever, seizure, chills, vomiting, diarrhea, which may be signs of serotonin syndrome Mood and behavior changes--anxiety,  nervousness, confusion, hallucinations, irritability, hostility, thoughts of suicide or self-harm, worsening mood, feelings of depression Prolonged or painful erection Raynaud syndrome--cool, numb, or painful fingers or toes that may change color from pale, to blue, to red Seizures Stroke--sudden numbness or weakness of the face, arm, or leg, trouble speaking, confusion, trouble walking, loss of balance or coordination, dizziness, severe headache, change in vision Side effects that usually do not require medical attention (report these to your care team if they continue or are bothersome): Dry mouth Headache Loss of appetite with weight loss Nausea Stomach pain Trouble sleeping This list may not describe all possible side effects. Call your doctor for medical advice about side effects. You may report side effects to FDA at 1-800-FDA-1088. Where should I keep my medication? Keep out of the reach of children and pets. This medication can be abused. Keep it in a safe place to protect it from theft. Do not share it with anyone. It is only for you. Selling or giving away this medication is dangerous and against the law. Store at room temperature between 15 and 30 degrees C (59 and 86 degrees F). Protect from light. Keep container tightly closed. Get rid of any unused medication after the expiration date. This medication may cause harm and death if it is taken by other adults, children, or pets. It is important to get rid of the medication as soon as you no longer need  it or it is expired. You can do this in two ways: Take the medication to a medication take-back program. Check with your pharmacy or law enforcement to find a location. If you cannot return the medication, check the label or package insert to see if the medication should be thrown out in the garbage or flushed down the toilet. If you are not sure, ask your care team. If it is safe to put it in the trash, take the medication out of the  container. Mix the medication with cat litter, dirt, coffee grounds, or other unwanted substance. Seal the mixture in a bag or container. Put it in the trash. NOTE: This sheet is a summary. It may not cover all possible information. If you have questions about this medicine, talk to your doctor, pharmacist, or health care provider.  2024 Elsevier/Gold Standard (2022-05-31 00:00:00)   Managing Anxiety, Adult After being diagnosed with anxiety, you may be relieved to know why you have felt or behaved a certain way. You may also feel overwhelmed about the treatment ahead and what it will mean for your life. With care and support, you can manage your anxiety. How to manage lifestyle changes Understanding the difference between stress and anxiety Although stress can play a role in anxiety, it is not the same as anxiety. Stress is your body's reaction to life changes and events, both good and bad. Stress is often caused by something external, such as a deadline, test, or competition. It normally goes away after the event has ended and will last just a few hours. But, stress can be ongoing and can lead to more than just stress. Anxiety is caused by something internal, such as imagining a terrible outcome or worrying that something will go wrong that will greatly upset you. Anxiety often does not go away even after the event is over, and it can become a long-term (chronic) worry. Lowering stress and anxiety Talk with your health care provider or a counselor to learn more about lowering anxiety and stress. They may suggest tension-reduction techniques, such as: Music. Spend time creating or listening to music that you enjoy and that inspires you. Mindfulness-based meditation. Practice being aware of your normal breaths while not trying to control your breathing. It can be done while sitting or walking. Centering prayer. Focus on a word, phrase, or sacred image that means something to you and brings you  peace. Deep breathing. Expand your stomach and inhale slowly through your nose. Hold your breath for 3-5 seconds. Then breathe out slowly, letting your stomach muscles relax. Self-talk. Learn to notice and spot thought patterns that lead to anxiety reactions. Change those patterns to thoughts that feel peaceful. Muscle relaxation. Take time to tense muscles and then relax them. Choose a tension-reduction technique that fits your lifestyle and personality. These techniques take time and practice. Set aside 5-15 minutes a day to do them. Specialized therapists can offer counseling and training in these techniques. The training to help with anxiety may be covered by some insurance plans. Other things you can do to manage stress and anxiety include: Keeping a stress diary. This can help you learn what triggers your reaction and then learn ways to manage your response. Thinking about how you react to certain situations. You may not be able to control everything, but you can control your response. Making time for activities that help you relax and not feeling guilty about spending your time in this way. Doing visual imagery. This involves imagining or  creating mental pictures to help you relax. Practicing yoga. Through yoga poses, you can lower tension and relax.  Medicines Medicines for anxiety include: Antidepressant medicines. These are usually prescribed for long-term daily control. Anti-anxiety medicines. These may be added in severe cases, especially when panic attacks occur. When used together, medicines, psychotherapy, and tension-reduction techniques may be the most effective treatment. Relationships Relationships can play a big part in helping you recover. Spend more time connecting with trusted friends and family members. Think about going to couples counseling if you have a partner, taking family education classes, or going to family therapy. Therapy can help you and others better understand  your anxiety. How to recognize changes in your anxiety Everyone responds differently to treatment for anxiety. Recovery from anxiety happens when symptoms lessen and stop interfering with your daily life at home or work. This may mean that you will start to: Have better concentration and focus. Worry will interfere less in your daily thinking. Sleep better. Be less irritable. Have more energy. Have improved memory. Try to recognize when your condition is getting worse. Contact your provider if your symptoms interfere with home or work and you feel like your condition is not improving. Follow these instructions at home: Activity Exercise. Adults should: Exercise for at least 150 minutes each week. The exercise should increase your heart rate and make you sweat (moderate-intensity exercise). Do strengthening exercises at least twice a week. Get the right amount and quality of sleep. Most adults need 7-9 hours of sleep each night. Lifestyle  Eat a healthy diet that includes plenty of vegetables, fruits, whole grains, low-fat dairy products, and lean protein. Do not eat a lot of foods that are high in fats, added sugars, or salt (sodium). Make choices that simplify your life. Do not use any products that contain nicotine or tobacco. These products include cigarettes, chewing tobacco, and vaping devices, such as e-cigarettes. If you need help quitting, ask your provider. Avoid caffeine, alcohol, and certain over-the-counter cold medicines. These may make you feel worse. Ask your pharmacist which medicines to avoid. General instructions Take over-the-counter and prescription medicines only as told by your provider. Keep all follow-up visits. This is to make sure you are managing your anxiety well or if you need more support. Where to find support You can get help and support from: Self-help groups. Online and [employer]. A trusted spiritual leader. Couples counseling. Family  education classes. Family therapy. Where to find more information You may find that joining a support group helps you deal with your anxiety. The following sources can help you find counselors or support groups near you: Mental Health [place]: mentalhealthamerica.net Anxiety and Depression Association of [place] (ADAA): adaa.org [employer] Mental Illness (NAMI): nami.org Contact a health care provider if: You have a hard time staying focused or finishing tasks. You spend many hours a day feeling worried about everyday life. You are very tired because you cannot stop worrying. You start to have headaches or often feel tense. You have chronic nausea or diarrhea. Get help right away if: Your heart feels like it is racing. You have shortness of breath. You have thoughts of hurting yourself or others. Get help right away if you feel like you may hurt yourself or others, or have thoughts about taking your own life. Go to your nearest emergency room or: Call 911. Call the National Suicide Prevention Lifeline at (858)525-5851 or 988. This is open 24 hours a day. Text the Crisis Text Line at  161096. This information is not intended to replace advice given to you by your health care provider. Make sure you discuss any questions you have with your health care provider. Document Revised: 12/30/2021 Document Reviewed: 07/14/2020 Elsevier Patient Education  2024 [employer].

## 2022-10-01 ENCOUNTER — Other Ambulatory Visit: Payer: Self-pay | Admitting: Nurse Practitioner

## 2022-10-01 ENCOUNTER — Telehealth: Payer: Self-pay

## 2022-10-01 DIAGNOSIS — E559 Vitamin D deficiency, unspecified: Secondary | ICD-10-CM

## 2022-10-01 LAB — CBC WITH DIFFERENTIAL/PLATELET
Basophils Absolute: 38 cells/uL (ref 0–200)
Eosinophils Absolute: 151 cells/uL (ref 15–500)
Eosinophils Relative: 2.4 %
HCT: 35.6 % (ref 35.0–45.0)
Hemoglobin: 11 g/dL — ABNORMAL LOW (ref 11.7–15.5)
Lymphs Abs: 3074 cells/uL (ref 850–3900)
MCH: 24.9 pg — ABNORMAL LOW (ref 27.0–33.0)
MCHC: 30.9 g/dL — ABNORMAL LOW (ref 32.0–36.0)
MPV: 10.3 fL (ref 7.5–12.5)
Monocytes Relative: 6.4 %
Neutro Abs: 2633 cells/uL (ref 1500–7800)
Platelets: 386 10*3/uL (ref 140–400)
RBC: 4.42 10*6/uL (ref 3.80–5.10)
RDW: 14 % (ref 11.0–15.0)
Total Lymphocyte: 48.8 %
WBC: 6.3 10*3/uL (ref 3.8–10.8)

## 2022-10-01 LAB — COMPLETE METABOLIC PANEL WITH GFR
AG Ratio: 1.3 (calc) (ref 1.0–2.5)
ALT: 14 U/L (ref 6–29)
AST: 13 U/L (ref 10–35)
Albumin: 4 g/dL (ref 3.6–5.1)
Alkaline phosphatase (APISO): 69 U/L (ref 31–125)
BUN/Creatinine Ratio: 9 (calc) (ref 6–22)
BUN: 9 mg/dL (ref 7–25)
CO2: 28 mmol/L (ref 20–32)
Calcium: 9.8 mg/dL (ref 8.6–10.2)
Chloride: 103 mmol/L (ref 98–110)
Creat: 1 mg/dL — ABNORMAL HIGH (ref 0.50–0.99)
Globulin: 3.2 g/dL (calc) (ref 1.9–3.7)
Glucose, Bld: 94 mg/dL (ref 65–99)
Potassium: 3.9 mmol/L (ref 3.5–5.3)
Sodium: 138 mmol/L (ref 135–146)
Total Bilirubin: 0.5 mg/dL (ref 0.2–1.2)
Total Protein: 7.2 g/dL (ref 6.1–8.1)
eGFR: 70 mL/min/{1.73_m2} (ref >=60)

## 2022-10-01 LAB — HEMOGLOBIN A1C
Hgb A1c MFr Bld: 6.2 % of total Hgb — ABNORMAL HIGH (ref <5.7)
Mean Plasma Glucose: 131 mg/dL
eAG (mmol/L): 7.3 mmol/L

## 2022-10-01 LAB — URINALYSIS, ROUTINE W REFLEX MICROSCOPIC
Bilirubin Urine: NEGATIVE
Glucose, UA: NEGATIVE
Hyaline Cast: NONE SEEN /LPF
Ketones, ur: NEGATIVE
Leukocytes,Ua: NEGATIVE
Nitrite: NEGATIVE
Protein, ur: NEGATIVE
RBC / HPF: NONE SEEN /HPF (ref 0–2)
Specific Gravity, Urine: 1.018 (ref 1.001–1.035)
WBC, UA: NONE SEEN /HPF (ref 0–5)
pH: <=5 (ref 5.0–8.0)

## 2022-10-01 LAB — LIPID PANEL
Cholesterol: 224 mg/dL — ABNORMAL HIGH (ref <200)
HDL: 49 mg/dL — ABNORMAL LOW (ref >=50)
LDL Cholesterol (Calc): 144 mg/dL (calc) — ABNORMAL HIGH
Non-HDL Cholesterol (Calc): 175 mg/dL (calc) — ABNORMAL HIGH (ref <130)
Total CHOL/HDL Ratio: 4.6 (calc) (ref <5.0)
Triglycerides: 177 mg/dL — ABNORMAL HIGH (ref <150)

## 2022-10-01 LAB — MICROALBUMIN / CREATININE URINE RATIO
Creatinine, Urine: 185 mg/dL (ref 20–275)
Microalb Creat Ratio: 2 mg/g creat (ref <30)
Microalb, Ur: 0.3 mg/dL

## 2022-10-01 LAB — MAGNESIUM: Magnesium: 1.7 mg/dL (ref 1.5–2.5)

## 2022-10-01 LAB — VITAMIN D 25 HYDROXY (VIT D DEFICIENCY, FRACTURES): Vit D, 25-Hydroxy: 32 ng/mL (ref 30–100)

## 2022-10-01 LAB — TSH: TSH: 2.49 mIU/L

## 2022-10-01 LAB — MICROSCOPIC MESSAGE

## 2022-10-01 MED ORDER — REPLESTA 1.25 MG (50000 UT) PO WAFR
WAFER | ORAL | 4 refills | Status: AC
Start: 2022-10-01 — End: ?

## 2022-10-01 NOTE — Telephone Encounter (Signed)
Vyvanse prior auth completed and submitted.

## 2022-10-06 NOTE — Telephone Encounter (Signed)
Prior auth approved through 09/30/25.

## 2022-10-12 ENCOUNTER — Encounter: Payer: Self-pay | Admitting: Nurse Practitioner

## 2022-11-16 ENCOUNTER — Telehealth: Payer: Self-pay | Admitting: Nurse Practitioner

## 2022-11-16 ENCOUNTER — Other Ambulatory Visit: Payer: Self-pay | Admitting: Nurse Practitioner

## 2022-11-16 DIAGNOSIS — F988 Other specified behavioral and emotional disorders with onset usually occurring in childhood and adolescence: Secondary | ICD-10-CM

## 2022-11-16 MED ORDER — LISDEXAMFETAMINE DIMESYLATE 20 MG PO CAPS
20.0000 mg | ORAL_CAPSULE | Freq: Every day | ORAL | 0 refills | Status: DC
Start: 2022-11-16 — End: 2022-12-22

## 2022-11-16 NOTE — Telephone Encounter (Signed)
Patient is requesting a refill on Vyvanse to [facility] on [address]

## 2022-12-22 ENCOUNTER — Telehealth: Payer: Self-pay | Admitting: Nurse Practitioner

## 2022-12-22 ENCOUNTER — Other Ambulatory Visit: Payer: Self-pay | Admitting: Nurse Practitioner

## 2022-12-22 DIAGNOSIS — F988 Other specified behavioral and emotional disorders with onset usually occurring in childhood and adolescence: Secondary | ICD-10-CM

## 2022-12-22 MED ORDER — LISDEXAMFETAMINE DIMESYLATE 20 MG PO CAPS
20.0000 mg | ORAL_CAPSULE | Freq: Every day | ORAL | 0 refills | Status: DC
Start: 2022-12-22 — End: 2022-12-31

## 2022-12-22 NOTE — Telephone Encounter (Signed)
Patient is requesting a refill on Vyvanse to [facility] on [address]
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
## 2022-12-31 ENCOUNTER — Encounter: Payer: Self-pay | Admitting: Nurse Practitioner

## 2022-12-31 ENCOUNTER — Ambulatory Visit: Payer: BC Managed Care – PPO | Admitting: Nurse Practitioner

## 2022-12-31 VITALS — BP 122/84 | HR 68 | Temp 97.5°F | Ht 61.5 in | Wt 194.0 lb

## 2022-12-31 DIAGNOSIS — Z79899 Other long term (current) drug therapy: Secondary | ICD-10-CM | POA: Diagnosis not present

## 2022-12-31 DIAGNOSIS — F3341 Major depressive disorder, recurrent, in partial remission: Secondary | ICD-10-CM

## 2022-12-31 DIAGNOSIS — I10 Essential (primary) hypertension: Secondary | ICD-10-CM

## 2022-12-31 DIAGNOSIS — R7309 Other abnormal glucose: Secondary | ICD-10-CM | POA: Diagnosis not present

## 2022-12-31 DIAGNOSIS — E782 Mixed hyperlipidemia: Secondary | ICD-10-CM

## 2022-12-31 DIAGNOSIS — E559 Vitamin D deficiency, unspecified: Secondary | ICD-10-CM

## 2022-12-31 DIAGNOSIS — F988 Other specified behavioral and emotional disorders with onset usually occurring in childhood and adolescence: Secondary | ICD-10-CM

## 2022-12-31 LAB — CBC WITH DIFFERENTIAL/PLATELET
Absolute Monocytes: 422 cells/uL (ref 200–950)
Basophils Absolute: 40 cells/uL (ref 0–200)
Basophils Relative: 0.6 %
Eosinophils Absolute: 181 cells/uL (ref 15–500)
Eosinophils Relative: 2.7 %
HCT: 35 % (ref 35.0–45.0)
Hemoglobin: 10.6 g/dL — ABNORMAL LOW (ref 11.7–15.5)
Lymphs Abs: 3511 cells/uL (ref 850–3900)
MCH: 25.4 pg — ABNORMAL LOW (ref 27.0–33.0)
MCHC: 30.3 g/dL — ABNORMAL LOW (ref 32.0–36.0)
MCV: 83.7 fL (ref 80.0–100.0)
MPV: 10.5 fL (ref 7.5–12.5)
Monocytes Relative: 6.3 %
Neutro Abs: 2546 cells/uL (ref 1500–7800)
Neutrophils Relative %: 38 %
Platelets: 398 10*3/uL (ref 140–400)
RBC: 4.18 10*6/uL (ref 3.80–5.10)
RDW: 14.2 % (ref 11.0–15.0)
Total Lymphocyte: 52.4 %
WBC: 6.7 10*3/uL (ref 3.8–10.8)

## 2022-12-31 MED ORDER — LISDEXAMFETAMINE DIMESYLATE 30 MG PO CAPS
30.0000 mg | ORAL_CAPSULE | Freq: Every day | ORAL | 0 refills | Status: DC
Start: 2022-12-31 — End: 2023-03-22

## 2022-12-31 NOTE — Patient Instructions (Signed)
Living With Attention Deficit Hyperactivity Disorder If you have been diagnosed with attention deficit hyperactivity disorder (ADHD), you may be relieved that you now know why you have felt or behaved a certain way. Still, you may feel overwhelmed about the treatment ahead. You may also wonder how to get the support you need and how to deal with the condition day-to-day. With treatment and support, you can live with ADHD and manage your symptoms. How to manage lifestyle changes Managing lifestyle changes can be challenging. Seeking support from your healthcare provider, therapist, family, and friends can be helpful. How to recognize changes in your condition The following signs may mean that your treatment is working well and your condition is improving: Consistently being on time for appointments. Being more organized at home and work. Other people noticing improvements in your behavior. Achieving goals that you set for yourself. Thinking more clearly. The following signs may mean that your treatment is not working very well: Feeling impatience or more confusion. Missing, forgetting, or being late for appointments. An increasing sense of disorganization and messiness. More difficulty in reaching goals that you set for yourself. Loved ones becoming angry or frustrated with you. Follow these instructions at home: Medicines Take over-the-counter and prescription medicines only as told by your health care provider. Check with your health care provider before taking any new medicines. General instructions Create structure and an organized atmosphere at home. For example: Make a list of tasks, then rank them from most important to least important. Work on one task at a time until your listed tasks are done. Make a daily schedule and follow it consistently every day. Use an appointment calendar, and check it 2-3 times a day to keep on track. Keep it with you when you leave the house. Create  spaces where you keep certain things, and always put things back in their places after you use them. Keep all follow-up visits. Your health care provider will need to monitor your condition and adjust your treatment over time. Where to find support Talking to others  Keep emotion out of important discussions and speak in a calm, logical way. Listen closely and patiently to your loved ones. Try to understand their point of view, and try to avoid getting defensive. Take responsibility for the consequences of your actions. Ask that others do not take your behaviors personally. Aim to solve problems as they come up, and express your feelings instead of bottling them up. Talk openly about what you need from your loved ones and how they can support you. Consider going to family therapy sessions or having your family meet with a specialist who deals with ADHD-related behavior problems. Finances Not all insurance plans cover mental health care, so it is important to check with your insurance carrier. If paying for co-pays or counseling services is a problem, search for a local or county mental health care center. Public mental health care services may be offered there at a low cost or no cost when you are not able to see a private health care provider. If you are taking medicine for ADHD, you may be able to get the generic form, which may be less expensive than brand-name medicine. Some makers of prescription medicines also offer help to patients who cannot afford the medicines that they need. Therapy and support groups Talking with a mental health care provider and participating in support groups can help to improve your quality of life, daily functioning, and overall symptoms. Questions to ask your health  care provider: What are the risks and benefits of taking medicines? Would I benefit from therapy? How often should I follow up with a health care provider? Where to find more information Learn more  about ADHD from: Children and Adults with Attention Deficit Hyperactivity Disorder: [email or web address] of Mental Health: BloggerCourse.com Centers for Disease Control and Prevention: TonerPromos.no Contact a health care provider if: You have side effects from your medicines, such as: Repeated muscle twitches, coughing, or speech outbursts. Sleep problems. Loss of appetite. Dizziness. Unusually fast heartbeat. Stomach pains. Headaches. You have new or worsening behavior problems. You are struggling with anxiety, depression, or substance abuse. Get help right away if: You have a severe reaction to a medicine. These symptoms may be an emergency. Get help right away. Call 911. Do not wait to see if the symptoms will go away. Do not drive yourself to the hospital. Take one of these steps if you feel like you may hurt yourself or others, or have thoughts about taking your own life: Go to your nearest emergency room. Call 911. Call the National Suicide Prevention Lifeline at 308-645-2909 or 988. This is open 24 hours a day. Text the Crisis Text Line at 2166556940. Summary With treatment and support, you can live with ADHD and manage your symptoms. Consider taking part in family therapy or self-help groups with family members or friends. When you talk with friends and family about your ADHD, be patient and communicate openly. Keep all follow-up visits. Your health care provider will need to monitor your condition and adjust your treatment over time. This information is not intended to replace advice given to you by your health care provider. Make sure you discuss any questions you have with your health care provider. Document Revised: 07/11/2021 Document Reviewed: 07/11/2021 Elsevier Patient Education  2024 [employer].

## 2023-01-01 LAB — COMPLETE METABOLIC PANEL WITH GFR
AG Ratio: 1.2 (calc) (ref 1.0–2.5)
ALT: 12 U/L (ref 6–29)
AST: 20 U/L (ref 10–35)
Albumin: 4 g/dL (ref 3.6–5.1)
Alkaline phosphatase (APISO): 68 U/L (ref 31–125)
BUN: 14 mg/dL (ref 7–25)
CO2: 26 mmol/L (ref 20–32)
Calcium: 9.6 mg/dL (ref 8.6–10.2)
Chloride: 104 mmol/L (ref 98–110)
Creat: 0.94 mg/dL (ref 0.50–0.99)
Globulin: 3.4 g/dL (ref 1.9–3.7)
Glucose, Bld: 85 mg/dL (ref 65–99)
Potassium: 4.2 mmol/L (ref 3.5–5.3)
Sodium: 137 mmol/L (ref 135–146)
Total Bilirubin: 0.5 mg/dL (ref 0.2–1.2)
Total Protein: 7.4 g/dL (ref 6.1–8.1)
eGFR: 76 mL/min/{1.73_m2} (ref >=60)

## 2023-01-01 LAB — LIPID PANEL
Cholesterol: 193 mg/dL (ref <200)
HDL: 49 mg/dL — ABNORMAL LOW (ref >=50)
LDL Cholesterol (Calc): 122 mg/dL — ABNORMAL HIGH
Non-HDL Cholesterol (Calc): 144 mg/dL — ABNORMAL HIGH (ref <130)
Total CHOL/HDL Ratio: 3.9 (calc) (ref <5.0)
Triglycerides: 113 mg/dL (ref <150)

## 2023-01-01 LAB — MAGNESIUM: Magnesium: 1.9 mg/dL (ref 1.5–2.5)

## 2023-01-06 ENCOUNTER — Telehealth: Payer: Self-pay

## 2023-01-06 NOTE — Telephone Encounter (Signed)
Prior auth completed and submitted. 

## 2023-01-13 ENCOUNTER — Telehealth: Payer: Self-pay

## 2023-01-13 NOTE — Telephone Encounter (Signed)
Prior auth completed and submitted. 

## 2023-01-13 NOTE — Telephone Encounter (Signed)
Prior auth approved

## 2023-03-22 ENCOUNTER — Other Ambulatory Visit: Payer: Self-pay | Admitting: Nurse Practitioner

## 2023-03-22 ENCOUNTER — Telehealth: Payer: Self-pay | Admitting: Nurse Practitioner

## 2023-03-22 DIAGNOSIS — F988 Other specified behavioral and emotional disorders with onset usually occurring in childhood and adolescence: Secondary | ICD-10-CM

## 2023-03-22 MED ORDER — LISDEXAMFETAMINE DIMESYLATE 30 MG PO CAPS: 30.0000 mg | ORAL_CAPSULE | Freq: Every day | ORAL | 0 refills | Status: DC

## 2023-03-22 NOTE — Telephone Encounter (Signed)
Refill on VYVANSE. Please send to [id number] - [place], [place] - [address] AT [address]

## 2023-05-28 ENCOUNTER — Other Ambulatory Visit: Payer: Self-pay | Admitting: Family

## 2023-05-28 DIAGNOSIS — F988 Other specified behavioral and emotional disorders with onset usually occurring in childhood and adolescence: Secondary | ICD-10-CM

## 2023-05-28 MED ORDER — LISDEXAMFETAMINE DIMESYLATE 30 MG PO CAPS: 30.0000 mg | ORAL_CAPSULE | Freq: Every day | ORAL | 0 refills | Status: AC

## 2023-06-15 ENCOUNTER — Ambulatory Visit: Payer: Self-pay | Admitting: Nurse Practitioner

## 2023-06-21 ENCOUNTER — Other Ambulatory Visit: Payer: Self-pay

## 2023-06-21 DIAGNOSIS — I10 Essential (primary) hypertension: Secondary | ICD-10-CM

## 2023-06-21 MED ORDER — BISOPROLOL-HYDROCHLOROTHIAZIDE 2.5-6.25 MG PO TABS: ORAL_TABLET | ORAL | 0 refills | Status: AC

## 2023-07-19 ENCOUNTER — Encounter: Payer: Self-pay | Admitting: Nurse Practitioner

## 2023-09-22 ENCOUNTER — Encounter: Payer: Self-pay | Admitting: Nurse Practitioner

## 2023-09-30 ENCOUNTER — Encounter: Payer: Self-pay | Admitting: Nurse Practitioner
